# Patient Record
Sex: Female | Born: 1970 | Race: Black or African American | Hispanic: No | Marital: Married | Smoking: Never smoker
Health system: Southern US, Community
[De-identification: ages and names within clinical notes are randomized; demographics above are authoritative.]

## PROBLEM LIST (undated history)

## (undated) DIAGNOSIS — F1120 Opioid dependence, uncomplicated: Secondary | ICD-10-CM

## (undated) DIAGNOSIS — IMO0001 Reserved for inherently not codable concepts without codable children: Secondary | ICD-10-CM

## (undated) DIAGNOSIS — F32A Depression, unspecified: Secondary | ICD-10-CM

## (undated) DIAGNOSIS — F419 Anxiety disorder, unspecified: Secondary | ICD-10-CM

## (undated) DIAGNOSIS — Z22322 Carrier or suspected carrier of Methicillin resistant Staphylococcus aureus: Secondary | ICD-10-CM

## (undated) DIAGNOSIS — I10 Essential (primary) hypertension: Secondary | ICD-10-CM

## (undated) DIAGNOSIS — F4323 Adjustment disorder with mixed anxiety and depressed mood: Secondary | ICD-10-CM

## (undated) DIAGNOSIS — M797 Fibromyalgia: Secondary | ICD-10-CM

## (undated) DIAGNOSIS — G8929 Other chronic pain: Secondary | ICD-10-CM

## (undated) DIAGNOSIS — G43909 Migraine, unspecified, not intractable, without status migrainosus: Secondary | ICD-10-CM

## (undated) DIAGNOSIS — R7303 Prediabetes: Secondary | ICD-10-CM

## (undated) DIAGNOSIS — F319 Bipolar disorder, unspecified: Secondary | ICD-10-CM

## (undated) DIAGNOSIS — F329 Major depressive disorder, single episode, unspecified: Secondary | ICD-10-CM

## (undated) HISTORY — PX: KNEE ARTHROPLASTY: SHX992

## (undated) HISTORY — PX: OTHER SURGICAL HISTORY: SHX169

## (undated) HISTORY — PX: ABDOMINAL HYSTERECTOMY: SHX81

## (undated) HISTORY — PX: EAR CYST EXCISION: SHX22

## (undated) HISTORY — PX: TUBAL LIGATION: SHX77

---

## 2003-05-08 ENCOUNTER — Ambulatory Visit (HOSPITAL_COMMUNITY): Admission: RE | Admit: 2003-05-08 | Discharge: 2003-05-08 | Payer: Self-pay | Admitting: Family Medicine

## 2003-05-08 ENCOUNTER — Encounter: Payer: Self-pay | Admitting: Family Medicine

## 2009-08-20 ENCOUNTER — Emergency Department (HOSPITAL_COMMUNITY): Admission: EM | Admit: 2009-08-20 | Discharge: 2009-08-21 | Payer: Self-pay | Admitting: Emergency Medicine

## 2010-11-24 ENCOUNTER — Ambulatory Visit (HOSPITAL_COMMUNITY): Payer: Medicaid Other | Admitting: Physician Assistant

## 2010-12-14 ENCOUNTER — Encounter (HOSPITAL_COMMUNITY): Payer: Medicaid Other | Admitting: Physician Assistant

## 2011-01-12 ENCOUNTER — Encounter (HOSPITAL_COMMUNITY): Payer: Medicaid Other | Admitting: Physician Assistant

## 2011-01-12 DIAGNOSIS — F39 Unspecified mood [affective] disorder: Secondary | ICD-10-CM

## 2011-02-15 ENCOUNTER — Encounter (INDEPENDENT_AMBULATORY_CARE_PROVIDER_SITE_OTHER): Payer: Medicaid Other | Admitting: Physician Assistant

## 2011-02-15 DIAGNOSIS — F39 Unspecified mood [affective] disorder: Secondary | ICD-10-CM

## 2011-03-16 ENCOUNTER — Encounter (HOSPITAL_COMMUNITY): Payer: Medicaid Other | Admitting: Physician Assistant

## 2011-03-17 ENCOUNTER — Encounter (INDEPENDENT_AMBULATORY_CARE_PROVIDER_SITE_OTHER): Payer: Medicaid Other | Admitting: Physician Assistant

## 2011-03-17 DIAGNOSIS — F410 Panic disorder [episodic paroxysmal anxiety] without agoraphobia: Secondary | ICD-10-CM

## 2011-03-17 DIAGNOSIS — F39 Unspecified mood [affective] disorder: Secondary | ICD-10-CM

## 2011-04-14 ENCOUNTER — Encounter (INDEPENDENT_AMBULATORY_CARE_PROVIDER_SITE_OTHER): Payer: Medicaid Other | Admitting: Physician Assistant

## 2011-04-14 DIAGNOSIS — F39 Unspecified mood [affective] disorder: Secondary | ICD-10-CM

## 2011-04-15 ENCOUNTER — Ambulatory Visit (HOSPITAL_COMMUNITY): Payer: Medicaid Other | Admitting: Psychology

## 2011-04-21 ENCOUNTER — Ambulatory Visit (HOSPITAL_COMMUNITY): Payer: Medicaid Other | Admitting: Psychology

## 2011-05-13 ENCOUNTER — Encounter (HOSPITAL_COMMUNITY): Payer: Medicaid Other | Admitting: Psychology

## 2011-05-17 ENCOUNTER — Encounter (INDEPENDENT_AMBULATORY_CARE_PROVIDER_SITE_OTHER): Payer: Medicaid Other | Admitting: Physician Assistant

## 2011-05-17 DIAGNOSIS — F39 Unspecified mood [affective] disorder: Secondary | ICD-10-CM

## 2011-05-27 ENCOUNTER — Encounter (INDEPENDENT_AMBULATORY_CARE_PROVIDER_SITE_OTHER): Payer: Medicaid Other | Admitting: Psychology

## 2011-05-27 DIAGNOSIS — F411 Generalized anxiety disorder: Secondary | ICD-10-CM

## 2011-05-27 DIAGNOSIS — F329 Major depressive disorder, single episode, unspecified: Secondary | ICD-10-CM

## 2011-06-22 ENCOUNTER — Encounter (HOSPITAL_COMMUNITY): Payer: Medicaid Other | Admitting: Physician Assistant

## 2011-06-22 ENCOUNTER — Encounter (INDEPENDENT_AMBULATORY_CARE_PROVIDER_SITE_OTHER): Payer: Medicaid Other | Admitting: Physician Assistant

## 2011-06-22 DIAGNOSIS — F39 Unspecified mood [affective] disorder: Secondary | ICD-10-CM

## 2011-06-22 DIAGNOSIS — F410 Panic disorder [episodic paroxysmal anxiety] without agoraphobia: Secondary | ICD-10-CM

## 2011-07-19 ENCOUNTER — Encounter (INDEPENDENT_AMBULATORY_CARE_PROVIDER_SITE_OTHER): Payer: Medicaid Other | Admitting: Psychology

## 2011-07-19 DIAGNOSIS — F319 Bipolar disorder, unspecified: Secondary | ICD-10-CM

## 2011-07-28 ENCOUNTER — Encounter (HOSPITAL_COMMUNITY): Payer: Self-pay | Admitting: Physician Assistant

## 2011-08-23 ENCOUNTER — Ambulatory Visit (HOSPITAL_COMMUNITY): Payer: Medicaid Other | Admitting: Physician Assistant

## 2011-08-23 ENCOUNTER — Other Ambulatory Visit (HOSPITAL_COMMUNITY): Payer: Self-pay | Admitting: Physician Assistant

## 2011-08-23 DIAGNOSIS — F39 Unspecified mood [affective] disorder: Secondary | ICD-10-CM

## 2011-08-23 DIAGNOSIS — F410 Panic disorder [episodic paroxysmal anxiety] without agoraphobia: Secondary | ICD-10-CM

## 2011-08-23 MED ORDER — QUETIAPINE FUMARATE 300 MG PO TABS: 300.0000 mg | ORAL_TABLET | Freq: Every day | ORAL | Status: DC

## 2011-08-23 MED ORDER — ARIPIPRAZOLE 5 MG PO TABS: 5.0000 mg | ORAL_TABLET | Freq: Every day | ORAL | Status: DC

## 2011-08-23 MED ORDER — DESVENLAFAXINE SUCCINATE ER 100 MG PO TB24: 100.0000 mg | ORAL_TABLET | Freq: Every day | ORAL | Status: DC

## 2011-08-30 ENCOUNTER — Ambulatory Visit (HOSPITAL_COMMUNITY): Payer: Medicaid Other | Admitting: Psychology

## 2011-09-06 ENCOUNTER — Ambulatory Visit (INDEPENDENT_AMBULATORY_CARE_PROVIDER_SITE_OTHER): Payer: Medicaid Other | Admitting: Physician Assistant

## 2011-09-06 DIAGNOSIS — F39 Unspecified mood [affective] disorder: Secondary | ICD-10-CM

## 2011-09-06 DIAGNOSIS — F411 Generalized anxiety disorder: Secondary | ICD-10-CM

## 2011-09-06 MED ORDER — HYDROXYZINE HCL 50 MG PO TABS: ORAL_TABLET | ORAL | Status: DC

## 2011-09-06 MED ORDER — LAMOTRIGINE 100 MG PO TABS: 100.0000 mg | ORAL_TABLET | Freq: Every day | ORAL | Status: DC

## 2011-09-06 NOTE — Progress Notes (Signed)
   [facility] Follow-up Outpatient Visit  Mikailah Morel Feb 07, 1971  Date: 09/06/11   Subjective: Debra Cardenas presents today for followup on her mood disorder and anxiety disorder. She reports that she is doing very well. She is experiencing less irritability. She reports that she uses the Vistaril only at nighttime with rare uses during the day. She cannot tell if the Lamictal has made much difference. She denies any suicidal or homicidal ideation. She denies any auditory or visual hallucinations. She received a letter for her financial aid and she is able to return to school. In January. She will be enrolled in 2 classes, a math class and reading glasses.  There were no vitals filed for this visit.  Mental Status Examination  Appearance: Well groomed and neatly dressed Alert: Yes Attention: good  Cooperative: Yes Eye Contact: Good Speech: Clear and even Psychomotor Activity: Normal Memory/Concentration: Intact Oriented: person, place, time/date and situation Mood: Euthymic Affect: Congruent Thought Processes and Associations: Linear Fund of Knowledge: Good Thought Content:  Insight: Fair Judgement: Good  Diagnosis: Mood disorder, not otherwise specified, generalized anxiety disorder  Treatment Plan: We will continue all of her medications as prescribed and Fidelia will return for a followup in one month. At that time we will consider increasing the Lamictal.  Dystany Duffy, PA

## 2011-09-19 ENCOUNTER — Other Ambulatory Visit (HOSPITAL_COMMUNITY): Payer: Self-pay | Admitting: Physician Assistant

## 2011-09-19 DIAGNOSIS — F39 Unspecified mood [affective] disorder: Secondary | ICD-10-CM

## 2011-09-19 MED ORDER — QUETIAPINE FUMARATE 300 MG PO TABS: 300.0000 mg | ORAL_TABLET | Freq: Every day | ORAL | Status: DC

## 2011-09-30 ENCOUNTER — Emergency Department (HOSPITAL_BASED_OUTPATIENT_CLINIC_OR_DEPARTMENT_OTHER)
Admission: EM | Admit: 2011-09-30 | Discharge: 2011-10-01 | Disposition: A | Discharge status: Home / Self Care | Payer: Medicaid Other | Attending: Emergency Medicine | Admitting: Emergency Medicine

## 2011-09-30 ENCOUNTER — Emergency Department (INDEPENDENT_AMBULATORY_CARE_PROVIDER_SITE_OTHER): Payer: Medicaid Other

## 2011-09-30 ENCOUNTER — Encounter (HOSPITAL_BASED_OUTPATIENT_CLINIC_OR_DEPARTMENT_OTHER): Payer: Self-pay | Admitting: *Deleted

## 2011-09-30 DIAGNOSIS — N949 Unspecified condition associated with female genital organs and menstrual cycle: Secondary | ICD-10-CM

## 2011-09-30 DIAGNOSIS — D72829 Elevated white blood cell count, unspecified: Secondary | ICD-10-CM

## 2011-09-30 DIAGNOSIS — N760 Acute vaginitis: Secondary | ICD-10-CM | POA: Insufficient documentation

## 2011-09-30 DIAGNOSIS — R109 Unspecified abdominal pain: Secondary | ICD-10-CM

## 2011-09-30 DIAGNOSIS — A499 Bacterial infection, unspecified: Secondary | ICD-10-CM | POA: Insufficient documentation

## 2011-09-30 DIAGNOSIS — F319 Bipolar disorder, unspecified: Secondary | ICD-10-CM | POA: Insufficient documentation

## 2011-09-30 DIAGNOSIS — Z79899 Other long term (current) drug therapy: Secondary | ICD-10-CM | POA: Insufficient documentation

## 2011-09-30 DIAGNOSIS — B9689 Other specified bacterial agents as the cause of diseases classified elsewhere: Secondary | ICD-10-CM | POA: Insufficient documentation

## 2011-09-30 DIAGNOSIS — K769 Liver disease, unspecified: Secondary | ICD-10-CM

## 2011-09-30 DIAGNOSIS — IMO0001 Reserved for inherently not codable concepts without codable children: Secondary | ICD-10-CM | POA: Insufficient documentation

## 2011-09-30 DIAGNOSIS — F341 Dysthymic disorder: Secondary | ICD-10-CM | POA: Insufficient documentation

## 2011-09-30 DIAGNOSIS — K5900 Constipation, unspecified: Secondary | ICD-10-CM

## 2011-09-30 HISTORY — DX: Depression, unspecified: F32.A

## 2011-09-30 HISTORY — DX: Bipolar disorder, unspecified: F31.9

## 2011-09-30 HISTORY — DX: Anxiety disorder, unspecified: F41.9

## 2011-09-30 HISTORY — DX: Fibromyalgia: M79.7

## 2011-09-30 HISTORY — DX: Major depressive disorder, single episode, unspecified: F32.9

## 2011-09-30 LAB — URINALYSIS, ROUTINE W REFLEX MICROSCOPIC
Hgb urine dipstick: NEGATIVE
Ketones, ur: 15 mg/dL — AB
Protein, ur: 30 mg/dL — AB
Urobilinogen, UA: 1 mg/dL (ref 0.0–1.0)

## 2011-09-30 LAB — DIFFERENTIAL
Basophils Absolute: 0 10*3/uL (ref 0.0–0.1)
Basophils Relative: 0 % (ref 0–1)
Eosinophils Absolute: 0 10*3/uL (ref 0.0–0.7)
Neutro Abs: 10.5 10*3/uL — ABNORMAL HIGH (ref 1.7–7.7)
Neutrophils Relative %: 73 % (ref 43–77)

## 2011-09-30 LAB — WET PREP, GENITAL: Yeast Wet Prep HPF POC: NONE SEEN

## 2011-09-30 LAB — COMPREHENSIVE METABOLIC PANEL
ALT: 17 U/L (ref 0–35)
Alkaline Phosphatase: 113 U/L (ref 39–117)
CO2: 24 mEq/L (ref 19–32)
Chloride: 104 mEq/L (ref 96–112)
GFR calc Af Amer: >90 mL/min (ref >90)
GFR calc non Af Amer: >90 mL/min (ref >90)
Glucose, Bld: 112 mg/dL — ABNORMAL HIGH (ref 70–99)
Potassium: 4.7 mEq/L (ref 3.5–5.1)
Sodium: 136 mEq/L (ref 135–145)
Total Bilirubin: 0.2 mg/dL — ABNORMAL LOW (ref 0.3–1.2)

## 2011-09-30 LAB — URINE MICROSCOPIC-ADD ON

## 2011-09-30 LAB — CBC
MCHC: 33.6 g/dL (ref 30.0–36.0)
RDW: 15.5 % (ref 11.5–15.5)

## 2011-09-30 MED ORDER — IOHEXOL 300 MG/ML  SOLN
100.0000 mL | Freq: Once | INTRAMUSCULAR | Status: AC | PRN
  Administered 2011-09-30: 100 mL via INTRAVENOUS

## 2011-09-30 NOTE — ED Provider Notes (Signed)
History     CSN: [id number]  Arrival date & time 09/30/11  9147   First MD Initiated Contact with Patient 09/30/11 1925      Chief Complaint  Patient presents with  . Abdominal Cramping    (Consider location/radiation/quality/duration/timing/severity/associated sxs/prior treatment) Patient is a 41 y.o. female presenting with abdominal pain. The history is provided by the patient. No language interpreter was used.  Abdominal Pain The primary symptoms of the illness include abdominal pain. The current episode started 3 to 5 hours ago. The onset of the illness was sudden. The problem has been gradually worsening.  Associated with: none. The patient states that she believes she is currently not pregnant. The patient has not had a change in bowel habit. Risk factors for an acute abdominal problem include a history of abdominal surgery. Symptoms associated with the illness do not include urgency, hematuria, frequency or back pain. Significant associated medical issues do not include PUD, GERD, inflammatory bowel disease, diabetes, gallstones, liver disease, substance abuse or diverticulitis.    Past Medical History  Diagnosis Date  . Depression   . Anxiety   . Bipolar 1 disorder   . Fibromyalgia     Past Surgical History  Procedure Date  . Tubal ligation   . Cesarean section     No family history on file.  History  Substance Use Topics  . Smoking status: Never Smoker   . Smokeless tobacco: Not on file  . Alcohol Use: No    OB History    Grav Para Term Preterm Abortions TAB SAB Ect Mult Living                  Review of Systems  Gastrointestinal: Positive for abdominal pain.  Genitourinary: Negative for urgency, frequency and hematuria.  Musculoskeletal: Negative for back pain.  All other systems reviewed and are negative.    Allergies  Penicillins and Tylenol  Home Medications   Current Outpatient Rx  Name Route Sig Dispense Refill  . ARIPIPRAZOLE 5 MG PO  TABS Oral Take 5 mg by mouth daily.    . DESVENLAFAXINE SUCCINATE ER 100 MG PO TB24 Oral Take 1 tablet (100 mg total) by mouth daily. 30 tablet 0  . HYDROXYZINE HCL 50 MG PO TABS  Take two tablets by mouth at bedtime, and one tablet daily as needed 90 tablet 0  . IBUPROFEN 200 MG PO TABS Oral Take 600 mg by mouth every 6 (six) hours as needed. For pain    . LAMOTRIGINE 100 MG PO TABS Oral Take 1 tablet (100 mg total) by mouth daily. 30 tablet 0  . OXYCODONE HCL 5 MG PO CAPS Oral Take 5 mg by mouth 2 (two) times daily. For pain    . PREGABALIN 225 MG PO CAPS Oral Take 225 mg by mouth 2 (two) times daily.    . QUETIAPINE FUMARATE 300 MG PO TABS Oral Take 1 tablet (300 mg total) by mouth at bedtime. 30 tablet 0  . TRAMADOL HCL 50 MG PO TABS Oral Take 50 mg by mouth every 6 (six) hours as needed. For pain    . ARIPIPRAZOLE 5 MG PO TABS Oral Take 1 tablet (5 mg total) by mouth daily. 30 tablet 0    BP 155/93  Pulse 97  Temp(Src) 98.2 F (36.8 C) (Oral)  Resp 16  Ht 5\' 7"  (1.702 m)  Wt 300 lb (136.079 kg)  BMI 46.99 kg/m2  SpO2 100%  Physical Exam  Nursing note and  vitals reviewed. Constitutional: She is oriented to person, place, and time. She appears well-developed and well-nourished.  HENT:  Head: Normocephalic and atraumatic.  Right Ear: External ear normal.  Left Ear: External ear normal.  Nose: Nose normal.  Mouth/Throat: Oropharynx is clear and moist.  Eyes: Pupils are equal, round, and reactive to light.  Neck: Normal range of motion. Neck supple.  Cardiovascular: Normal rate and normal heart sounds.   Pulmonary/Chest: Effort normal.  Abdominal: Soft. There is tenderness.  Genitourinary: Vagina normal.       Adnexa nontender, no masses  Musculoskeletal: Normal range of motion.  Neurological: She is alert and oriented to person, place, and time. She has normal reflexes.  Skin: Skin is warm.  Psychiatric: She has a normal mood and affect.    ED Course  Procedures  (including critical care time)  Labs Reviewed  WET PREP, GENITAL - Abnormal; Notable for the following:    Clue Cells, Wet Prep MODERATE (*)    WBC, Wet Prep HPF POC MODERATE (*)    All other components within normal limits  URINALYSIS, ROUTINE W REFLEX MICROSCOPIC - Abnormal; Notable for the following:    Color, Urine AMBER (*) BIOCHEMICALS MAY BE AFFECTED BY COLOR   APPearance CLOUDY (*)    Specific Gravity, Urine 1.041 (*)    Bilirubin Urine SMALL (*)    Ketones, ur 15 (*)    Protein, ur 30 (*)    All other components within normal limits  URINE MICROSCOPIC-ADD ON - Abnormal; Notable for the following:    Squamous Epithelial / LPF FEW (*)    Bacteria, UA FEW (*)    All other components within normal limits  GC/CHLAMYDIA PROBE AMP, GENITAL   Dg Abd 1 View  09/30/2011  *RADIOLOGY REPORT*  Clinical Data: Severe pelvic pain.  History of tubal ligation and prior ablation.  ABDOMEN - 1 VIEW  Comparison: None.  Findings: 2 supine views of the abdomen and pelvis.  Extensive density difference secondary to overlying pannus.  No gross free intraperitoneal air.  No bowel obstruction.  Moderate amount of colonic stool. No abnormal abdominal calcifications.   No appendicolith.  IMPRESSION: No acute findings.  Possible constipation.  Original Report Authenticated By: Consuello Bossier, M.D.     1. Bacterial vaginosis       MDM  [facility],  Pt has a few clue cells,  Pt was given torodol before coming here.  Pt reports pain was better but now is returning.  I ordered Ct scan.  Medical screening examination/treatment/procedure(s) were conducted as a shared visit with non-physician practitioner(s) and myself.  I personally evaluated the patient during the encounter Patient had laboratory testing which showed bacterial vaginosis.She can take metronidazole 500 mg bid x 7 days to treat bacterial vaginosis.   CT the abdomen showed a small lesion in the liver.  The radiologist  recommended that this be further evaluated with MRI with and without contrast as a non-emergency study.  She can request her private physician to order this test for her.      Langston Masker, [place] 09/30/11 2242  Carleene Cooper III, MD 10/01/11 0010  Carleene Cooper III, MD 10/01/11 Burna Mortimer

## 2011-09-30 NOTE — ED Notes (Signed)
[facility] wbc 15.0 and abd cramping x 2 days

## 2011-10-01 ENCOUNTER — Emergency Department (HOSPITAL_BASED_OUTPATIENT_CLINIC_OR_DEPARTMENT_OTHER): Payer: Medicaid Other

## 2011-10-01 MED ORDER — METRONIDAZOLE 500 MG PO TABS: 500.0000 mg | ORAL_TABLET | Freq: Two times a day (BID) | ORAL | Status: DC

## 2011-10-01 MED ORDER — METRONIDAZOLE 500 MG PO TABS
500.0000 mg | ORAL_TABLET | Freq: Once | ORAL | Status: AC
Start: 2011-10-01 — End: 2011-10-01
  Administered 2011-10-01: 500 mg via ORAL
  Filled 2011-10-01: qty 1

## 2011-10-01 MED ORDER — METRONIDAZOLE 500 MG PO TABS: 500.0000 mg | ORAL_TABLET | Freq: Two times a day (BID) | ORAL | Status: AC

## 2011-10-01 NOTE — Discharge Instructions (Signed)
Debra Cardenas, you had physical examination and laboratory testing and CT x-ray of the abdomen and pelvis to check on you for abdominal pain. Your laboratory tests showed to have a vaginal infection called bacterial vaginosis. He will need to take the medicine metronidazole 500 mg twice a day for one week to treat this infection. You should not drink alcohol while taking metronidazole as that could cause you to vomit.  The CT x-ray of your abdomen and pelvis showed a small spot on your liver. The radiologist recommends that you have a MRI of the liver with and without contrast to check on this further. This test can be ordered by your regular doctor.Bacterial Vaginosis Bacterial vaginosis (BV) is a vaginal infection where the normal balance of bacteria in the vagina is disrupted. The normal balance is then replaced by an overgrowth of certain bacteria. There are several different kinds of bacteria that can cause BV. BV is the most common vaginal infection in women of childbearing age. CAUSES   The cause of BV is not fully understood. BV develops when there is an increase or imbalance of harmful bacteria.   Some activities or behaviors can upset the normal balance of bacteria in the vagina and put women at increased risk including:   Having a new sex partner or multiple sex partners.   Douching.   Using an intrauterine device (IUD) for contraception.   It is not clear what role sexual activity plays in the development of BV. However, women that have never had sexual intercourse are rarely infected with BV.  Women do not get BV from toilet seats, bedding, swimming pools or from touching objects around them.  SYMPTOMS   Grey vaginal discharge.   A fish-like odor with discharge, especially after sexual intercourse.   Itching or burning of the vagina and vulva.   Burning or pain with urination.   Some women have no signs or symptoms at all.  DIAGNOSIS  Your caregiver must examine the  vagina for signs of BV. Your caregiver will perform lab tests and look at the sample of vaginal fluid through a microscope. They will look for bacteria and abnormal cells (clue cells), a pH test higher than 4.5, and a positive amine test all associated with BV.  RISKS AND COMPLICATIONS   Pelvic inflammatory disease (PID).   Infections following gynecology surgery.   Developing HIV.   Developing herpes virus.  TREATMENT  Sometimes BV will clear up without treatment. However, all women with symptoms of BV should be treated to avoid complications, especially if gynecology surgery is planned. Female partners generally do not need to be treated. However, BV may spread between female sex partners so treatment is helpful in preventing a recurrence of BV.   BV may be treated with antibiotics. The antibiotics come in either pill or vaginal cream forms. Either can be used with nonpregnant or pregnant women, but the recommended dosages differ. These antibiotics are not harmful to the baby.   BV can recur after treatment. If this happens, a second round of antibiotics will often be prescribed.   Treatment is important for pregnant women. If not treated, BV can cause a premature delivery, especially for a pregnant woman who had a premature birth in the past. All pregnant women who have symptoms of BV should be checked and treated.   For chronic reoccurrence of BV, treatment with a type of prescribed gel vaginally twice a week is helpful.  HOME CARE INSTRUCTIONS   Finish all medication  as directed by your caregiver.   Do not have sex until treatment is completed.   Tell your sexual partner that you have a vaginal infection. They should see their caregiver and be treated if they have problems, such as a mild rash or itching.   Practice safe sex. Use condoms. Only have 1 sex partner.  PREVENTION  Basic prevention steps can help reduce the risk of upsetting the natural balance of bacteria in the vagina  and developing BV:  Do not have sexual intercourse (be abstinent).   Do not douche.   Use all of the medicine prescribed for treatment of BV, even if the signs and symptoms go away.   Tell your sex partner if you have BV. That way, they can be treated, if needed, to prevent reoccurrence.  SEEK MEDICAL CARE IF:   Your symptoms are not improving after 3 days of treatment.   You have increased discharge, pain, or fever.  MAKE SURE YOU:   Understand these instructions.   Will watch your condition.   Will get help right away if you are not doing well or get worse.  FOR MORE INFORMATION  Division of STD Prevention (DSTDP), Centers for Disease Control and Prevention: SolutionApps.co.za American Social Health Association (ASHA): www.ashastd.org  Document Released: 09/05/2005 Document Revised: 05/18/2011 Document Reviewed: 02/26/2009 [facility] Patient Information 2012 [place], [place].

## 2011-10-03 LAB — GC/CHLAMYDIA PROBE AMP, GENITAL
Chlamydia, DNA Probe: NEGATIVE
GC Probe Amp, Genital: NEGATIVE

## 2011-10-04 ENCOUNTER — Other Ambulatory Visit (HOSPITAL_BASED_OUTPATIENT_CLINIC_OR_DEPARTMENT_OTHER): Payer: Self-pay | Admitting: Emergency Medicine

## 2011-10-08 ENCOUNTER — Ambulatory Visit (HOSPITAL_BASED_OUTPATIENT_CLINIC_OR_DEPARTMENT_OTHER)
Admission: RE | Admit: 2011-10-08 | Discharge: 2011-10-08 | Disposition: A | Discharge status: Home / Self Care | Payer: Medicaid Other | Source: Ambulatory Visit | Attending: Emergency Medicine | Admitting: Emergency Medicine

## 2011-10-08 DIAGNOSIS — K7689 Other specified diseases of liver: Secondary | ICD-10-CM | POA: Insufficient documentation

## 2011-10-08 DIAGNOSIS — R109 Unspecified abdominal pain: Secondary | ICD-10-CM | POA: Insufficient documentation

## 2011-10-08 DIAGNOSIS — K8020 Calculus of gallbladder without cholecystitis without obstruction: Secondary | ICD-10-CM | POA: Insufficient documentation

## 2011-10-08 MED ORDER — GADOBENATE DIMEGLUMINE 529 MG/ML IV SOLN
20.0000 mL | Freq: Once | INTRAVENOUS | Status: AC | PRN
Start: 2011-10-08 — End: 2011-10-08
  Administered 2011-10-08: 20 mL via INTRAVENOUS

## 2011-10-12 ENCOUNTER — Ambulatory Visit (HOSPITAL_COMMUNITY): Payer: Medicaid Other | Admitting: Physician Assistant

## 2011-11-07 ENCOUNTER — Ambulatory Visit (INDEPENDENT_AMBULATORY_CARE_PROVIDER_SITE_OTHER): Payer: Medicaid Other | Admitting: Physician Assistant

## 2011-11-07 DIAGNOSIS — F39 Unspecified mood [affective] disorder: Secondary | ICD-10-CM

## 2011-11-07 MED ORDER — QUETIAPINE FUMARATE 300 MG PO TABS: 300.0000 mg | ORAL_TABLET | Freq: Every day | ORAL | Status: DC

## 2011-11-07 NOTE — Progress Notes (Signed)
   [facility] Follow-up Outpatient Visit  Debra Cardenas 23-Sep-1970  Date: 11/07/11   Subjective: Debra Cardenas presents today to followup on her medications for her depression and anxiety. She reports that she developed a sore place on her tongue, and some smaller bumps in her mouth, so she discontinued the Lamictal. She had her daughter take a photograph of the lesion on her tongue, and she showed that photograph to me. The photograph showed a red and raw appearing lesion on the right lateral surface of her tongue, that was approximately 2 cm in length. Debra Cardenas complains that her mood is somewhat "up and down." Her sleep is good, and she is eating well. She has changed her diet recently to fruits and vegetables, which has been a struggle, but she hopes to lose some weight. She denies any suicidal or homicidal ideation. She denies any auditory or visual hallucinations.  There were no vitals filed for this visit.  Mental Status Examination  Appearance: Well groomed and dressed Alert: Yes Attention: good  Cooperative: Yes Eye Contact: Good Speech: Clear and even Psychomotor Activity: Normal Memory/Concentration: Intact Oriented: person, place, time/date and situation Mood: Anxious Affect: Appropriate Thought Processes and Associations: Circumstantial and Linear Fund of Knowledge: Good Thought Content:  Insight: Good Judgement: Good  Diagnosis: Mood disorder not otherwise specified  Treatment Plan: We will discontinue her Lamictal as she developed sores in her mouth. We will continue her other medications as currently prescribed and have her followup in one month. We may consider Topamax as a mood stabilizer. She is encouraged to resume a walking routine to improve her mood as well as lose weight.  Debra Newmann, PA

## 2011-11-14 ENCOUNTER — Other Ambulatory Visit (HOSPITAL_COMMUNITY): Payer: Self-pay | Admitting: *Deleted

## 2011-11-14 DIAGNOSIS — F410 Panic disorder [episodic paroxysmal anxiety] without agoraphobia: Secondary | ICD-10-CM

## 2011-11-14 DIAGNOSIS — F39 Unspecified mood [affective] disorder: Secondary | ICD-10-CM

## 2011-11-14 MED ORDER — DESVENLAFAXINE SUCCINATE ER 100 MG PO TB24: 100.0000 mg | ORAL_TABLET | Freq: Every day | ORAL | Status: DC

## 2011-11-14 MED ORDER — ARIPIPRAZOLE 5 MG PO TABS: 5.0000 mg | ORAL_TABLET | Freq: Every day | ORAL | Status: DC

## 2011-11-17 ENCOUNTER — Other Ambulatory Visit (HOSPITAL_COMMUNITY): Payer: Self-pay | Admitting: *Deleted

## 2011-11-17 DIAGNOSIS — F39 Unspecified mood [affective] disorder: Secondary | ICD-10-CM

## 2011-11-17 MED ORDER — HYDROXYZINE HCL 50 MG PO TABS: ORAL_TABLET | ORAL | Status: DC

## 2011-12-06 ENCOUNTER — Ambulatory Visit (HOSPITAL_COMMUNITY): Payer: Medicaid Other | Admitting: Physician Assistant

## 2011-12-07 ENCOUNTER — Ambulatory Visit (HOSPITAL_COMMUNITY): Payer: Medicaid Other | Admitting: Physician Assistant

## 2011-12-19 ENCOUNTER — Other Ambulatory Visit (HOSPITAL_COMMUNITY): Payer: Self-pay | Admitting: *Deleted

## 2011-12-19 DIAGNOSIS — F410 Panic disorder [episodic paroxysmal anxiety] without agoraphobia: Secondary | ICD-10-CM

## 2011-12-19 DIAGNOSIS — F39 Unspecified mood [affective] disorder: Secondary | ICD-10-CM

## 2011-12-19 MED ORDER — QUETIAPINE FUMARATE 300 MG PO TABS: 300.0000 mg | ORAL_TABLET | Freq: Every day | ORAL | Status: DC

## 2011-12-19 MED ORDER — DESVENLAFAXINE SUCCINATE ER 100 MG PO TB24: 100.0000 mg | ORAL_TABLET | Freq: Every day | ORAL | Status: DC

## 2011-12-19 MED ORDER — ARIPIPRAZOLE 5 MG PO TABS: 5.0000 mg | ORAL_TABLET | Freq: Every day | ORAL | Status: DC

## 2012-01-18 ENCOUNTER — Ambulatory Visit (INDEPENDENT_AMBULATORY_CARE_PROVIDER_SITE_OTHER): Payer: Medicaid Other | Admitting: Physician Assistant

## 2012-01-18 DIAGNOSIS — F411 Generalized anxiety disorder: Secondary | ICD-10-CM

## 2012-01-18 DIAGNOSIS — F39 Unspecified mood [affective] disorder: Secondary | ICD-10-CM | POA: Insufficient documentation

## 2012-01-18 MED ORDER — HYDROXYZINE HCL 50 MG PO TABS: ORAL_TABLET | ORAL | Status: DC

## 2012-01-18 MED ORDER — CARBAMAZEPINE ER 100 MG PO TB12: 100.0000 mg | ORAL_TABLET | Freq: Two times a day (BID) | ORAL | Status: DC

## 2012-01-18 MED ORDER — QUETIAPINE FUMARATE 300 MG PO TABS: 300.0000 mg | ORAL_TABLET | Freq: Every day | ORAL | Status: DC

## 2012-01-18 NOTE — Progress Notes (Signed)
   [facility] Follow-up Outpatient Visit  Debra Cardenas 12-26-1970 Date: 01/18/2012  Subjective: Debra Cardenas presents today to followup on her medications prescribed for anxiety and mood swings. She has been off of the Lamictal for about 2 months now. She reports that she is doing "okay" but has been experiencing mood swings. She also endorses poor sleep, and takes her Seroquel at about 8 PM but cannot fall asleep until midnight. She then has to get up at 6. She endorses that she has been exercising several days a week. She denies any suicidal or homicidal ideation she denies any visual hallucinations, but does endorse some auditory hallucinations of hearing her name called. She also reports that she is having some anxiety while driving.  There were no vitals filed for this visit.  Mental Status Examination  Appearance: Well groomed and neatly dressed Alert: Yes Attention: good  Cooperative: Yes Eye Contact: Good Speech: Clear and even Psychomotor Activity: Normal Memory/Concentration: Intact Oriented: person, place, time/date and situation Mood: Euthymic Affect: Appropriate Thought Processes and Associations: Goal Directed and Linear Fund of Knowledge: Fair Thought Content: Auditory hallucinations Insight: Good Judgement: Good  Diagnosis: Mood disorder not otherwise specified, generalized anxiety disorder  Treatment Plan: We'd discussed several mood stabilizers, and found that she had had adverse reactions to many. She reports that Topamax and Depakote gave her headaches, and Neurontin caused her legs to swell. She is uncertain if she is taking Tegretol, so we will start her on 100 mg twice daily. We will continue her Abilify at 5 mg daily Thayer Ohm take 100 mg daily Seroquel 300 mg daily and Vistaril 100 mg at bedtime. She will followup in 6 weeks.  Lev Cervone, PA-C

## 2012-01-19 ENCOUNTER — Ambulatory Visit (HOSPITAL_COMMUNITY): Payer: Medicaid Other | Admitting: Physician Assistant

## 2012-02-29 ENCOUNTER — Ambulatory Visit (HOSPITAL_COMMUNITY): Payer: Self-pay | Admitting: Physician Assistant

## 2012-03-19 ENCOUNTER — Other Ambulatory Visit (HOSPITAL_COMMUNITY): Payer: Self-pay | Admitting: *Deleted

## 2012-03-19 DIAGNOSIS — F39 Unspecified mood [affective] disorder: Secondary | ICD-10-CM

## 2012-03-19 MED ORDER — QUETIAPINE FUMARATE 300 MG PO TABS: 300.0000 mg | ORAL_TABLET | Freq: Every day | ORAL | Status: DC

## 2012-04-11 ENCOUNTER — Ambulatory Visit (HOSPITAL_COMMUNITY): Payer: Self-pay | Admitting: Physician Assistant

## 2012-05-18 ENCOUNTER — Telehealth (HOSPITAL_COMMUNITY): Payer: Self-pay

## 2012-05-18 NOTE — Telephone Encounter (Signed)
1:15pm 05/18/12 pt called for a refill on her medication SEROQUEL 300MG  - informed pt to call her pharmacy and also that we are closed on Monday due to holiday./sh

## 2012-05-23 ENCOUNTER — Telehealth (HOSPITAL_COMMUNITY): Payer: Self-pay | Admitting: *Deleted

## 2012-05-23 ENCOUNTER — Other Ambulatory Visit (HOSPITAL_COMMUNITY): Payer: Self-pay | Admitting: *Deleted

## 2012-05-23 DIAGNOSIS — F39 Unspecified mood [affective] disorder: Secondary | ICD-10-CM

## 2012-05-23 MED ORDER — QUETIAPINE FUMARATE 300 MG PO TABS: 300.0000 mg | ORAL_TABLET | Freq: Every day | ORAL | Status: DC

## 2012-05-24 ENCOUNTER — Other Ambulatory Visit (HOSPITAL_COMMUNITY): Payer: Self-pay | Admitting: *Deleted

## 2012-05-24 DIAGNOSIS — F39 Unspecified mood [affective] disorder: Secondary | ICD-10-CM

## 2012-05-24 DIAGNOSIS — F410 Panic disorder [episodic paroxysmal anxiety] without agoraphobia: Secondary | ICD-10-CM

## 2012-05-24 MED ORDER — DESVENLAFAXINE SUCCINATE ER 100 MG PO TB24: 100.0000 mg | ORAL_TABLET | Freq: Every day | ORAL | Status: DC

## 2012-05-24 MED ORDER — CARBAMAZEPINE ER 100 MG PO TB12: 100.0000 mg | ORAL_TABLET | Freq: Two times a day (BID) | ORAL | Status: DC

## 2012-05-24 MED ORDER — ARIPIPRAZOLE 5 MG PO TABS: 5.0000 mg | ORAL_TABLET | Freq: Every day | ORAL | Status: DC

## 2012-05-24 NOTE — Telephone Encounter (Signed)
Contacted pt on 9/4 to clarify which pharmacy to send refills to as have received requests from [facility] and [facility]. Pt states send refill to pharmacy that request came from

## 2012-05-24 NOTE — Telephone Encounter (Signed)
Error

## 2012-06-07 ENCOUNTER — Ambulatory Visit (INDEPENDENT_AMBULATORY_CARE_PROVIDER_SITE_OTHER): Payer: Medicaid Other | Admitting: Physician Assistant

## 2012-06-07 DIAGNOSIS — F410 Panic disorder [episodic paroxysmal anxiety] without agoraphobia: Secondary | ICD-10-CM

## 2012-06-07 DIAGNOSIS — F39 Unspecified mood [affective] disorder: Secondary | ICD-10-CM

## 2012-06-07 DIAGNOSIS — F411 Generalized anxiety disorder: Secondary | ICD-10-CM

## 2012-06-07 MED ORDER — ARIPIPRAZOLE 10 MG PO TABS: 10.0000 mg | ORAL_TABLET | Freq: Every day | ORAL | Status: DC

## 2012-06-07 MED ORDER — HYDROXYZINE HCL 50 MG PO TABS: ORAL_TABLET | ORAL | Status: DC

## 2012-06-07 MED ORDER — CARBAMAZEPINE ER 100 MG PO TB12: 100.0000 mg | ORAL_TABLET | Freq: Every day | ORAL | Status: DC

## 2012-07-25 ENCOUNTER — Other Ambulatory Visit (HOSPITAL_COMMUNITY): Payer: Self-pay | Admitting: *Deleted

## 2012-07-25 DIAGNOSIS — F39 Unspecified mood [affective] disorder: Secondary | ICD-10-CM

## 2012-07-25 MED ORDER — QUETIAPINE FUMARATE 300 MG PO TABS: 300.0000 mg | ORAL_TABLET | Freq: Every day | ORAL | Status: DC

## 2012-07-25 NOTE — Telephone Encounter (Signed)
Pt called to request refill of Seroquel

## 2012-08-07 ENCOUNTER — Ambulatory Visit (INDEPENDENT_AMBULATORY_CARE_PROVIDER_SITE_OTHER): Payer: Medicaid Other | Admitting: Physician Assistant

## 2012-08-07 DIAGNOSIS — F410 Panic disorder [episodic paroxysmal anxiety] without agoraphobia: Secondary | ICD-10-CM

## 2012-08-07 DIAGNOSIS — F39 Unspecified mood [affective] disorder: Secondary | ICD-10-CM

## 2012-08-07 DIAGNOSIS — F411 Generalized anxiety disorder: Secondary | ICD-10-CM

## 2012-08-07 MED ORDER — ARIPIPRAZOLE 10 MG PO TABS: 10.0000 mg | ORAL_TABLET | Freq: Every day | ORAL | Status: DC

## 2012-08-07 MED ORDER — CARBAMAZEPINE ER 100 MG PO TB12: 100.0000 mg | ORAL_TABLET | Freq: Every day | ORAL | Status: DC

## 2012-08-07 MED ORDER — HYDROXYZINE HCL 50 MG PO TABS: ORAL_TABLET | ORAL | Status: DC

## 2012-08-07 MED ORDER — QUETIAPINE FUMARATE 300 MG PO TABS: 300.0000 mg | ORAL_TABLET | Freq: Every day | ORAL | Status: DC

## 2012-08-07 MED ORDER — DESVENLAFAXINE SUCCINATE ER 100 MG PO TB24: 100.0000 mg | ORAL_TABLET | Freq: Every day | ORAL | Status: DC

## 2012-08-07 NOTE — Progress Notes (Signed)
   [facility] Follow-up Outpatient Visit  Debra Cardenas 01/13/1971  Date: 08/07/2012   Subjective: Debra Cardenas presents today to followup on her treatment for her mood disorder and anxiety. She reports that overall she is doing very well. Her mood is still mildly labile, but her mood changes seem to be dependent on her environment. She is sleeping well with the Seroquel, and takes Vistaril approximately 4 nights out of 7. She continues to attend classes at [employer], and her grades are good-1 A and 2 B's. She is about halfway through that program, and plans to attend [place] [employer] after completion. She expresses an interest in working in the substance abuse field. She denies any suicidal or homicidal ideation. She denies any auditory or visual hallucinations.  There were no vitals filed for this visit.  Mental Status Examination  Appearance: Well groomed and nicely dressed Alert: Yes Attention: good  Cooperative: Yes Eye Contact: Good Speech: Clear and coherent Psychomotor Activity: Normal Memory/Concentration: Intact Oriented: person, place, time/date and situation Mood: Euthymic Affect: Appropriate Thought Processes and Associations: Linear Fund of Knowledge: Good Thought Content: Normal Insight: Good Judgement: Good  Diagnosis: Mood disorder not otherwise specified, generalized anxiety disorder.  Treatment Plan: We will continue her Abilify 10 mg daily, Tegretol-XR 100 mg each morning , prostatic 100 mg daily, Vistaril 100 mg at bedtime as needed, Seroquel 300 mg at bedtime. She will return for followup in 3 months.  Takirah Binford, PA-C

## 2012-08-07 NOTE — Progress Notes (Signed)
   [facility] Follow-up Outpatient Visit  Debra Cardenas [birth year]  Date: 06/07/2012   Subjective: [name] presents today to followup on her treatment for her mood disorder and anxiety. She continues to take classes at [employer] in [employer] studying criminal justice and substance abuse. She reports that the Tegretol helps with her mood some days but she feels really high on other days. Without the Tegretol she gets really low. She takes her Seroquel between 8:30 and 9:30, and Vistaril to help her fall asleep. She continues take about 3 hours to fall asleep and gets up at 7:30 AM. She denies any suicidal or homicidal ideation. She denies any auditory or visual hallucinations.  There were no vitals filed for this visit.  Mental Status Examination  Appearance: Well groomed and nicely dressed Alert: Yes Attention: good  Cooperative: Yes Eye Contact: Good Speech: Clear and coherent Psychomotor Activity: Normal Memory/Concentration: Intact Oriented: person, place, time/date and situation Mood: Euthymic Affect: Appropriate Thought Processes and Associations: Linear Fund of Knowledge: Good Thought Content: Normal Insight: Good Judgement: Good  Diagnosis: Mood disorder not otherwise specified, generalized anxiety disorder.    Treatment Plan: We will increase her Abilify to 10 mg, and decrease her Tegretol XL to 100 mg each morning. We'll continue her Seroquel 300 mg each evening and Vistaril 100 mg at bedtime as needed. She will return for followup in 2 months.  Sven Pinheiro, PA-C

## 2012-09-21 ENCOUNTER — Emergency Department (HOSPITAL_BASED_OUTPATIENT_CLINIC_OR_DEPARTMENT_OTHER)
Admission: EM | Admit: 2012-09-21 | Discharge: 2012-09-21 | Disposition: A | Discharge status: Home / Self Care | Payer: Medicaid Other | Attending: Emergency Medicine | Admitting: Emergency Medicine

## 2012-09-21 ENCOUNTER — Encounter (HOSPITAL_BASED_OUTPATIENT_CLINIC_OR_DEPARTMENT_OTHER): Payer: Self-pay | Admitting: *Deleted

## 2012-09-21 DIAGNOSIS — R102 Pelvic and perineal pain: Secondary | ICD-10-CM

## 2012-09-21 DIAGNOSIS — Z79899 Other long term (current) drug therapy: Secondary | ICD-10-CM | POA: Insufficient documentation

## 2012-09-21 DIAGNOSIS — N949 Unspecified condition associated with female genital organs and menstrual cycle: Secondary | ICD-10-CM | POA: Insufficient documentation

## 2012-09-21 DIAGNOSIS — F329 Major depressive disorder, single episode, unspecified: Secondary | ICD-10-CM | POA: Insufficient documentation

## 2012-09-21 DIAGNOSIS — Z9851 Tubal ligation status: Secondary | ICD-10-CM | POA: Insufficient documentation

## 2012-09-21 DIAGNOSIS — Z3202 Encounter for pregnancy test, result negative: Secondary | ICD-10-CM | POA: Insufficient documentation

## 2012-09-21 DIAGNOSIS — F3289 Other specified depressive episodes: Secondary | ICD-10-CM | POA: Insufficient documentation

## 2012-09-21 LAB — URINALYSIS, ROUTINE W REFLEX MICROSCOPIC
Bilirubin Urine: NEGATIVE
Nitrite: NEGATIVE
Specific Gravity, Urine: 1.027 (ref 1.005–1.030)
Urobilinogen, UA: 0.2 mg/dL (ref 0.0–1.0)

## 2012-09-21 LAB — WET PREP, GENITAL: Trich, Wet Prep: NONE SEEN

## 2012-09-21 LAB — URINE MICROSCOPIC-ADD ON

## 2012-09-21 MED ORDER — HYDROMORPHONE HCL PF 2 MG/ML IJ SOLN
2.0000 mg | Freq: Once | INTRAMUSCULAR | Status: AC
  Administered 2012-09-21: 2 mg via INTRAMUSCULAR
  Filled 2012-09-21: qty 1

## 2012-09-21 MED ORDER — OXYCODONE HCL 15 MG PO TABS: 15.0000 mg | ORAL_TABLET | ORAL | Status: DC | PRN

## 2012-09-21 NOTE — ED Notes (Signed)
States she is having uterus pain. Has had this pain on and off since having an abrasion 2011.

## 2012-09-21 NOTE — ED Provider Notes (Signed)
History     CSN: [id number]  Arrival date & time 09/21/12  1409   First MD Initiated Contact with Patient 09/21/12 1427      Chief Complaint  Patient presents with  . Pelvic Pain    (Consider location/radiation/quality/duration/timing/severity/associated sxs/prior treatment) The history is provided by the patient. No language interpreter was used.   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  She is normally followed at [facility].  Patient states that she's been taking her Roxicodone without relief of her symptoms.  She's also been taking tramadol.  Today her pain was so great that she was unable to stand and her husband made her come to the emergency department.  She did contact her primary doctor but was unable to get in with him today. Patient states that she has had this pain intermittently and has been treated previously with Flagyl which has decreased her symptoms. Patient describes the pain as bilateral pelvic crampy pain.  She rates it at a 10 out of 10 today.  It is intermittent.  She denies any bleeding from the uterus, she denies any vaginal symptoms. Patient denies any her nares symptoms.  Denies fevers, chills, myalgias, arthralgias. Denies DOE, SOB, chest tightness or pressure, radiation to left arm, jaw or back, or diaphoresis. Denies dysuria, flank pain, suprapubic pain, frequency, urgency, or hematuria. Denies headaches, light headedness, weakness, visual disturbances. Denies  nausea, vomiting, diarrhea or constipation.   Past Medical History  Diagnosis Date  . Depression     Past Surgical History  Procedure Date  . Cesarean section   . Tubal ligation   . Uterine ablasion     No family history on file.  History  Substance Use Topics  . Smoking status: Not on file  . Smokeless tobacco: Not on file  . Alcohol Use: No      OB History    Grav Para Term Preterm Abortions TAB SAB Ect Mult Living                  Review of Systems Ten systems reviewed and are negative for acute change, except as noted in the HPI.   Allergies  Review of patient's allergies indicates not on file.  Home Medications   Current Outpatient Rx  Name  Route  Sig  Dispense  Refill  . ABILIFY PO   Oral   Take by mouth.         . TEGRETOL PO   Oral   Take by mouth.         Marland Kitchen PRISTIQ PO   Oral   Take by mouth.         Marland Kitchen LASIX PO   Oral   Take by mouth.         Marland Kitchen GLUCOSE BLOOD VI STRP   Other   1 each by Other route as needed. Use as instructed         . VISTARIL PO   Oral   Take by mouth.         Marland Kitchen LIDOCAINE 5 % EX PTCH   Transdermal   Place 1 patch onto the skin daily. Remove & Discard patch within 12 hours or as directed by MD         . MOBIC PO   Oral   Take by mouth.         Marland Kitchen  ROXICODONE PO   Oral   Take by mouth.         Marland Kitchen LYRICA PO   Oral   Take by mouth.         . SEROQUEL PO   Oral   Take by mouth.         . TRAMADOL HCL PO   Oral   Take by mouth.           BP 130/73  Pulse 98  Temp 97.9 F (36.6 C) (Oral)  Resp 12  Ht 5\' 7"  (1.702 m)  Wt 320 lb (145.151 kg)  BMI 50.12 kg/m2  SpO2 97%  Physical Exam Physical Exam  Nursing note and vitals reviewed. Constitutional: She is oriented to person, place, and time. She appears well-developed and well-nourished. No distress.  HENT:  Head: Normocephalic and atraumatic.  Eyes: Conjunctivae normal and EOM are normal. Pupils are equal, round, and reactive to light. No scleral icterus.  Neck: Normal range of motion.  Cardiovascular: Normal rate, regular rhythm and normal heart sounds.  Exam reveals no gallop and no friction rub.   No murmur heard. Pulmonary/Chest: Effort normal and breath sounds normal. No respiratory distress.  Abdominal: Soft. Bowel sounds are normal. She exhibits no distension and no mass.  There is no tenderness. There is no guarding.  Neurological: She is alert and oriented to person, place, and time.  Skin: Skin is warm and dry. She is not diaphoretic.  Genitourinary: Pelvic exam: VULVA: normal appearing vulva with no masses, tenderness or lesions, VAGINA: vaginal discharge - malodorous, milky and thin, CERVIX: normal appearing cervix without discharge or lesions,exam chaperoned .   ED Course  Procedures (including critical care time)   Labs Reviewed  WET PREP, GENITAL  GC/CHLAMYDIA PROBE AMP  URINALYSIS, ROUTINE W REFLEX MICROSCOPIC  PREGNANCY, URINE   No results found.   1. Chronic pelvic pain in female       MDM  3:43 PM BP 130/73  Pulse 98  Temp 97.9 F (36.6 C) (Oral)  Resp 12  Ht 5\' 7"  (1.702 m)  Wt 320 lb (145.151 kg)  BMI 50.12 kg/m2  SpO2 97% Patient with malodorous discharge. Awaiting wet prep.  4:20 PM Patient with moderate amount of hemoglobin on her UA.  They're few clue cells on wet prep consistent with bacterial vaginosis infection.  Patient is scheduled for a pelvic ultrasound on January 9. He has close outpatient followup with her OB/GYN provider.  Patient has fairly benign abdominal and pelvic exam.  She is only mild tenderness with insertion of the speculum.  I do not feel that she needs an ultrasound or further imaging at this time as she is already scheduled.  There is no bleeding or signs of cervicitis.  We'll discharge patient with alteration in her pain medication regimen.  She may followup with her OB/GYN provider.      Arthor Captain, PA-C 09/23/12 2130

## 2012-09-22 LAB — GC/CHLAMYDIA PROBE AMP
CT Probe RNA: NEGATIVE
GC Probe RNA: NEGATIVE

## 2012-09-23 NOTE — ED Provider Notes (Signed)
Medical screening examination/treatment/procedure(s) were performed by non-physician practitioner and as supervising physician I was immediately available for consultation/collaboration.  Geoffery Lyons, MD 09/23/12 2134

## 2012-10-20 ENCOUNTER — Encounter (HOSPITAL_BASED_OUTPATIENT_CLINIC_OR_DEPARTMENT_OTHER): Payer: Self-pay | Admitting: Emergency Medicine

## 2012-10-20 ENCOUNTER — Emergency Department (HOSPITAL_BASED_OUTPATIENT_CLINIC_OR_DEPARTMENT_OTHER)
Admission: EM | Admit: 2012-10-20 | Discharge: 2012-10-20 | Disposition: A | Discharge status: Home / Self Care | Payer: Medicaid Other | Attending: Emergency Medicine | Admitting: Emergency Medicine

## 2012-10-20 DIAGNOSIS — Z8614 Personal history of Methicillin resistant Staphylococcus aureus infection: Secondary | ICD-10-CM | POA: Insufficient documentation

## 2012-10-20 DIAGNOSIS — Z79899 Other long term (current) drug therapy: Secondary | ICD-10-CM | POA: Insufficient documentation

## 2012-10-20 DIAGNOSIS — Z90710 Acquired absence of both cervix and uterus: Secondary | ICD-10-CM | POA: Insufficient documentation

## 2012-10-20 DIAGNOSIS — L299 Pruritus, unspecified: Secondary | ICD-10-CM | POA: Insufficient documentation

## 2012-10-20 DIAGNOSIS — L509 Urticaria, unspecified: Secondary | ICD-10-CM

## 2012-10-20 DIAGNOSIS — Z8659 Personal history of other mental and behavioral disorders: Secondary | ICD-10-CM | POA: Insufficient documentation

## 2012-10-20 DIAGNOSIS — T7840XA Allergy, unspecified, initial encounter: Secondary | ICD-10-CM

## 2012-10-20 DIAGNOSIS — T4995XA Adverse effect of unspecified topical agent, initial encounter: Secondary | ICD-10-CM | POA: Insufficient documentation

## 2012-10-20 HISTORY — DX: Carrier or suspected carrier of methicillin resistant Staphylococcus aureus: Z22.322

## 2012-10-20 MED ORDER — PREDNISONE 20 MG PO TABS
40.0000 mg | ORAL_TABLET | Freq: Once | ORAL | Status: AC
  Administered 2012-10-20: 40 mg via ORAL
  Filled 2012-10-20: qty 2

## 2012-10-20 MED ORDER — DIPHENHYDRAMINE HCL 25 MG PO CAPS
50.0000 mg | ORAL_CAPSULE | Freq: Once | ORAL | Status: AC
  Administered 2012-10-20: 50 mg via ORAL
  Filled 2012-10-20: qty 2

## 2012-10-20 MED ORDER — FAMOTIDINE 20 MG PO TABS: 20.0000 mg | ORAL_TABLET | Freq: Two times a day (BID) | ORAL | Status: DC

## 2012-10-20 MED ORDER — PREDNISONE 20 MG PO TABS: 40.0000 mg | ORAL_TABLET | Freq: Every day | ORAL | Status: DC

## 2012-10-20 MED ORDER — DIPHENHYDRAMINE HCL 25 MG PO TABS
25.0000 mg | ORAL_TABLET | Freq: Four times a day (QID) | ORAL | Status: DC | PRN
Start: 2012-10-20 — End: 2014-02-10

## 2012-10-20 NOTE — ED Notes (Signed)
Hysterectomy yesterday.   Tonight has whelps and itching.  Denies difficulty breathing or swallowing

## 2012-10-20 NOTE — ED Notes (Signed)
Whelps noted to upper extremeties, abdomen and back, pt recently had hysterectomy, also reports being treated with bactrim for mrsa in right ear. Pt reports taking medications for at least 12 days now

## 2012-10-20 NOTE — ED Provider Notes (Signed)
History     CSN: [id number]  Arrival date & time 10/20/12  0018   First MD Initiated Contact with Patient 10/20/12 0044      Chief Complaint  Patient presents with  . Rash    (Consider location/radiation/quality/duration/timing/severity/associated sxs/prior treatment) HPI Comments: 42 year old female who presents approximately 24 hours after having a hysterectomy which was done laparoscopically. Her chief complaint is a rash which he stated started approximately 7:30 PM on January 31. This rash has been persistent, associated with redness and itching and is located all over her body and patches. It seems to move, is not in the same place at all times and is very itchy. She denies any swelling of her tongue, difficulty breathing or wheezing or coughing. She has never had a reaction like this in the past. She is unsure if she has been exposed to any topical exposures, medication exposures or food exposures but has not had any new exposures at home. She has been taking Bactrim for otitis externa related to a MRSA infection for the last 12 days and has not had this symptom in the past with Bactrim. Currently the symptoms are mild to moderate, persistent, nothing seems to make it better or worse.  Patient is a 42 y.o. female presenting with rash. The history is provided by the patient and the spouse.  Rash     Past Medical History  Diagnosis Date  . Depression   . MRSA (methicillin resistant staph aureus) culture positive     Past Surgical History  Procedure Date  . Cesarean section   . Tubal ligation   . Uterine ablasion   . Abdominal hysterectomy   . Knee arthroplasty   . Ear cyst excision     x3    No family history on file.  History  Substance Use Topics  . Smoking status: Not on file  . Smokeless tobacco: Not on file  . Alcohol Use: No    OB History    Grav Para Term Preterm Abortions TAB SAB Ect Mult Living                  Review of Systems  Skin: Positive for  rash.  All other systems reviewed and are negative.    Allergies  Penicillins and Tylenol  Home Medications   Current Outpatient Rx  Name  Route  Sig  Dispense  Refill  . ABILIFY PO   Oral   Take by mouth.         . TEGRETOL PO   Oral   Take by mouth.         Marland Kitchen PRISTIQ PO   Oral   Take by mouth.         Marland Kitchen DIPHENHYDRAMINE HCL 25 MG PO TABS   Oral   Take 1 tablet (25 mg total) by mouth every 6 (six) hours as needed for itching (Rash).   30 tablet   0   . FAMOTIDINE 20 MG PO TABS   Oral   Take 1 tablet (20 mg total) by mouth 2 (two) times daily.   30 tablet   0   . LASIX PO   Oral   Take by mouth.         Marland Kitchen GLUCOSE BLOOD VI STRP   Other   1 each by Other route as needed. Use as instructed         . VISTARIL PO   Oral   Take by mouth.         Marland Kitchen  LIDOCAINE 5 % EX PTCH   Transdermal   Place 1 patch onto the skin daily. Remove & Discard patch within 12 hours or as directed by MD         . MOBIC PO   Oral   Take by mouth.         . OXYCODONE HCL 15 MG PO TABS   Oral   Take 1 tablet (15 mg total) by mouth every 4 (four) hours as needed for pain.   24 tablet   0   . ROXICODONE PO   Oral   Take by mouth.         Marland Kitchen PREDNISONE 20 MG PO TABS   Oral   Take 2 tablets (40 mg total) by mouth daily.   10 tablet   0   . LYRICA PO   Oral   Take by mouth.         . SEROQUEL PO   Oral   Take by mouth.         . TRAMADOL HCL PO   Oral   Take by mouth.           BP 122/67  Pulse 107  Resp 18  SpO2 96%  Physical Exam  Nursing note and vitals reviewed. Constitutional: She appears well-developed and well-nourished. No distress.  HENT:  Head: Normocephalic and atraumatic.  Mouth/Throat: Oropharynx is clear and moist. No oropharyngeal exudate.       Mucous membranes are dry, no intraoral lesions  Eyes: Conjunctivae normal and EOM are normal. Pupils are equal, round, and reactive to light. Right eye exhibits no discharge. Left  eye exhibits no discharge. No scleral icterus.  Neck: Normal range of motion. Neck supple. No JVD present. No thyromegaly present.  Cardiovascular: Normal rate, regular rhythm, normal heart sounds and intact distal pulses.  Exam reveals no gallop and no friction rub.   No murmur heard. Pulmonary/Chest: Effort normal and breath sounds normal. No respiratory distress. She has no wheezes. She has no rales.  Abdominal: Soft. Bowel sounds are normal. She exhibits no distension and no mass. There is tenderness.       Minimal diffuse tenderness of the abdomen, no guarding, no masses, incisions are well approximated with staples, scattered urticarial lesions across the abdominal wall, no peritoneal signs  Musculoskeletal: Normal range of motion. She exhibits no edema and no tenderness.  Lymphadenopathy:    She has no cervical adenopathy.  Neurological: She is alert. Coordination normal.  Skin: Skin is warm and dry. Rash noted. No erythema.       Urticarial rash is scattered across the upper and lower extremities, lower back, upper neck, chest and abdomen.  Psychiatric: She has a normal mood and affect. Her behavior is normal.    ED Course  Procedures (including critical care time)  Labs Reviewed - No data to display No results found.   1. Allergic reaction   2. Urticaria       MDM  Overall the patient appears to have a mild to moderate allergic reaction. She is currently taking Roxicodone pain medication which she has used in the past as well. Her specific exposure is unknown at this time, I have thoroughly not through a long list of possible exposures of which she has not noted any correlation. She has dry mucous membranes but has been drinking plenty of fluids and does not feel dehydrated otherwise. She will be given Pepcid, Benadryl, prednisone his prescriptions after being treated with prednisone  and Benadryl in the emergency department. She appears stable for discharge, I have thoroughly  reviewed with her the indications for return including worsening allergic reaction symptoms. She is expressed her understanding. The patient appears stable for discharge at this time        Vida Roller, MD 10/20/12 [phone]

## 2012-10-22 ENCOUNTER — Encounter (HOSPITAL_BASED_OUTPATIENT_CLINIC_OR_DEPARTMENT_OTHER): Payer: Self-pay | Admitting: Emergency Medicine

## 2012-10-23 ENCOUNTER — Other Ambulatory Visit (HOSPITAL_COMMUNITY): Payer: Self-pay | Admitting: *Deleted

## 2012-10-23 DIAGNOSIS — F410 Panic disorder [episodic paroxysmal anxiety] without agoraphobia: Secondary | ICD-10-CM

## 2012-10-23 DIAGNOSIS — F39 Unspecified mood [affective] disorder: Secondary | ICD-10-CM

## 2012-10-25 MED ORDER — DESVENLAFAXINE SUCCINATE ER 100 MG PO TB24: 100.0000 mg | ORAL_TABLET | Freq: Every day | ORAL | Status: DC

## 2012-10-25 MED ORDER — ARIPIPRAZOLE 10 MG PO TABS: 10.0000 mg | ORAL_TABLET | Freq: Every day | ORAL | Status: DC

## 2012-10-25 MED ORDER — CARBAMAZEPINE ER 100 MG PO TB12: 100.0000 mg | ORAL_TABLET | Freq: Every day | ORAL | Status: DC

## 2012-11-13 ENCOUNTER — Ambulatory Visit (INDEPENDENT_AMBULATORY_CARE_PROVIDER_SITE_OTHER): Payer: Medicaid Other | Admitting: Physician Assistant

## 2012-11-13 DIAGNOSIS — F411 Generalized anxiety disorder: Secondary | ICD-10-CM

## 2012-11-13 DIAGNOSIS — F39 Unspecified mood [affective] disorder: Secondary | ICD-10-CM

## 2012-11-13 MED ORDER — QUETIAPINE FUMARATE 300 MG PO TABS: 300.0000 mg | ORAL_TABLET | Freq: Every day | ORAL | Status: DC

## 2012-11-13 NOTE — Progress Notes (Signed)
   [facility] Follow-up Outpatient Visit  Shaquinta Peruski [facility] 1970-11-22  Date: 11/13/2012   Subjective: Nury presents today to followup on her treatment for her anxiety and mood disorder. She underwent a total hysterectomy approximately 3 weeks ago. Her recovery is going well, but she is concerned that it is affecting her performance at school. She reports that her GPA dropped from a 4.0 to a 3.7.  She is currently taking a statistics class which is difficult for her. She reports that her mood is good, although she has some mood swings. She endorses some periods of down days when she wants to isolate. She reports that she will have about 3 days in row about 3-4 times a month where she feels down. She denies that she is crying. Her sleep is good. She reports that her anxiety is nonexistent. She denies any suicidal or homicidal ideation. She does endorse some auditory hallucinations of her name being called every few days. She denies any visual hallucinations.  There were no vitals filed for this visit.  Mental Status Examination  Appearance: Well groomed and nicely dressed Alert: Yes Attention: good  Cooperative: Yes Eye Contact: Good Speech: Clear and coherent Psychomotor Activity: Normal Memory/Concentration: Intact Oriented: person, place, time/date and situation Mood: Euthymic Affect: Congruent Thought Processes and Associations: Linear Fund of Knowledge: Good Thought Content: Auditory hallucinations Insight: Good Judgement: Good  Diagnosis: Mood disorder not otherwise specified, generalized anxiety disorder  Treatment Plan: We will continue her Abilify 10 mg daily, Tegretol-XR 100 mg each morning , Pristiq 100 mg daily, Vistaril 100 mg at bedtime as needed, Seroquel 300 mg at bedtime. She will return for followup in 3 months.   Tawnie Ehresman, PA-C

## 2012-11-14 ENCOUNTER — Emergency Department (HOSPITAL_BASED_OUTPATIENT_CLINIC_OR_DEPARTMENT_OTHER)
Admission: EM | Admit: 2012-11-14 | Discharge: 2012-11-14 | Disposition: A | Discharge status: Home / Self Care | Payer: Medicaid Other | Attending: Emergency Medicine | Admitting: Emergency Medicine

## 2012-11-14 ENCOUNTER — Encounter (HOSPITAL_BASED_OUTPATIENT_CLINIC_OR_DEPARTMENT_OTHER): Payer: Self-pay | Admitting: Emergency Medicine

## 2012-11-14 DIAGNOSIS — H53149 Visual discomfort, unspecified: Secondary | ICD-10-CM | POA: Insufficient documentation

## 2012-11-14 DIAGNOSIS — F319 Bipolar disorder, unspecified: Secondary | ICD-10-CM | POA: Insufficient documentation

## 2012-11-14 DIAGNOSIS — R110 Nausea: Secondary | ICD-10-CM | POA: Insufficient documentation

## 2012-11-14 DIAGNOSIS — G43909 Migraine, unspecified, not intractable, without status migrainosus: Secondary | ICD-10-CM | POA: Insufficient documentation

## 2012-11-14 DIAGNOSIS — Z79899 Other long term (current) drug therapy: Secondary | ICD-10-CM | POA: Insufficient documentation

## 2012-11-14 DIAGNOSIS — Z791 Long term (current) use of non-steroidal anti-inflammatories (NSAID): Secondary | ICD-10-CM | POA: Insufficient documentation

## 2012-11-14 DIAGNOSIS — Z8614 Personal history of Methicillin resistant Staphylococcus aureus infection: Secondary | ICD-10-CM | POA: Insufficient documentation

## 2012-11-14 DIAGNOSIS — IMO0001 Reserved for inherently not codable concepts without codable children: Secondary | ICD-10-CM | POA: Insufficient documentation

## 2012-11-14 DIAGNOSIS — F411 Generalized anxiety disorder: Secondary | ICD-10-CM | POA: Insufficient documentation

## 2012-11-14 MED ORDER — DIPHENHYDRAMINE HCL 50 MG/ML IJ SOLN
25.0000 mg | Freq: Once | INTRAMUSCULAR | Status: AC
  Administered 2012-11-14: 17:00:00 via INTRAMUSCULAR
  Filled 2012-11-14: qty 1

## 2012-11-14 MED ORDER — METOCLOPRAMIDE HCL 5 MG/ML IJ SOLN
10.0000 mg | Freq: Once | INTRAMUSCULAR | Status: AC
  Administered 2012-11-14: 10 mg via INTRAMUSCULAR
  Filled 2012-11-14: qty 2

## 2012-11-14 MED ORDER — KETOROLAC TROMETHAMINE 30 MG/ML IJ SOLN
60.0000 mg | Freq: Once | INTRAMUSCULAR | Status: AC
  Administered 2012-11-14: 60 mg via INTRAMUSCULAR
  Filled 2012-11-14: qty 2

## 2012-11-14 NOTE — ED Provider Notes (Signed)
History     CSN: [id number]  Arrival date & time 11/14/12  1605   First MD Initiated Contact with Patient 11/14/12 [phone]      Chief Complaint  Patient presents with  . Migraine    (Consider location/radiation/quality/duration/timing/severity/associated sxs/prior treatment) Patient is a 42 y.o. female presenting with migraines. The history is provided by the patient. No language interpreter was used.  Migraine This is a new problem. The current episode started in the past 7 days. The problem occurs intermittently. The problem has been unchanged. Associated symptoms include nausea. Pertinent negatives include no fever or vomiting. Exacerbated by: light. She has tried NSAIDs for the symptoms. The treatment provided no relief.    Past Medical History  Diagnosis Date  . Anxiety   . Bipolar 1 disorder   . Fibromyalgia   . Depression   . MRSA (methicillin resistant staph aureus) culture positive     Past Surgical History  Procedure Laterality Date  . Cesarean section    . Tubal ligation    . Uterine ablasion    . Abdominal hysterectomy    . Knee arthroplasty    . Ear cyst excision      x3    No family history on file.  History  Substance Use Topics  . Smoking status: Never Smoker   . Smokeless tobacco: Not on file  . Alcohol Use: No    OB History   Grav Para Term Preterm Abortions TAB SAB Ect Mult Living                  Review of Systems  Constitutional: Negative for fever.  Respiratory: Negative.   Cardiovascular: Negative.   Gastrointestinal: Positive for nausea. Negative for vomiting.    Allergies  Penicillins; Penicillins; Tylenol; and Tylenol  Home Medications   Current Outpatient Rx  Name  Route  Sig  Dispense  Refill  . ARIPiprazole (ABILIFY) 10 MG tablet   Oral   Take 1 tablet (10 mg total) by mouth daily.   30 tablet   5   . carbamazepine (TEGRETOL XR) 100 MG 12 hr tablet   Oral   Take 1 tablet (100 mg total) by mouth daily.   30  tablet   5   . desvenlafaxine (PRISTIQ) 100 MG 24 hr tablet   Oral   Take 1 tablet (100 mg total) by mouth daily.   30 tablet   5   . diphenhydrAMINE (BENADRYL) 25 MG tablet   Oral   Take 1 tablet (25 mg total) by mouth every 6 (six) hours as needed for itching (Rash).   30 tablet   0   . famotidine (PEPCID) 20 MG tablet   Oral   Take 1 tablet (20 mg total) by mouth 2 (two) times daily.   30 tablet   0   . Furosemide (LASIX PO)   Oral   Take by mouth.         Marland Kitchen glucose blood test strip   Other   1 each by Other route as needed. Use as instructed         . hydrOXYzine (ATARAX/VISTARIL) 50 MG tablet      Take two tablets by mouth at bedtime, and one tablet daily as needed   90 tablet   2   . HydrOXYzine Pamoate (VISTARIL PO)   Oral   Take by mouth.         Marland Kitchen ibuprofen (ADVIL,MOTRIN) 200 MG tablet   Oral  Take 600 mg by mouth every 6 (six) hours as needed. For pain         . lidocaine (LIDODERM) 5 %   Transdermal   Place 1 patch onto the skin daily. Remove & Discard patch within 12 hours or as directed by MD         . Meloxicam (MOBIC PO)   Oral   Take by mouth.         Marland Kitchen oxycodone (OXY-IR) 5 MG capsule   Oral   Take 5 mg by mouth 2 (two) times daily. For pain         . oxyCODONE (ROXICODONE) 15 MG immediate release tablet   Oral   Take 1 tablet (15 mg total) by mouth every 4 (four) hours as needed for pain.   24 tablet   0   . OxyCODONE HCl (ROXICODONE PO)   Oral   Take by mouth.         . predniSONE (DELTASONE) 20 MG tablet   Oral   Take 2 tablets (40 mg total) by mouth daily.   10 tablet   0   . Pregabalin (LYRICA PO)   Oral   Take by mouth.         . pregabalin (LYRICA) 225 MG capsule   Oral   Take 225 mg by mouth 2 (two) times daily.         . QUEtiapine (SEROQUEL) 300 MG tablet   Oral   Take 1 tablet (300 mg total) by mouth at bedtime.   30 tablet   4   . traMADol (ULTRAM) 50 MG tablet   Oral   Take 50 mg  by mouth every 6 (six) hours as needed. For pain         . TRAMADOL HCL PO   Oral   Take by mouth.           BP 163/84  Pulse 91  Temp(Src) 98.2 F (36.8 C) (Oral)  Resp 18  Ht 5\' 7"  (1.702 m)  Wt 340 lb (154.223 kg)  BMI 53.24 kg/m2  SpO2 99%  Physical Exam  Nursing note and vitals reviewed. Constitutional: She is oriented to person, place, and time. She appears well-developed and well-nourished.  HENT:  Head: Normocephalic and atraumatic.  Eyes: Conjunctivae and EOM are normal. Pupils are equal, round, and reactive to light.  Neck: Normal range of motion. Neck supple.  Cardiovascular: Normal rate and regular rhythm.   Pulmonary/Chest: Effort normal and breath sounds normal.  Musculoskeletal: Normal range of motion.  Neurological: She is alert and oriented to person, place, and time.  Skin: Skin is warm and dry.  Psychiatric: She has a normal mood and affect.    ED Course  Procedures (including critical care time)  Labs Reviewed - No data to display No results found.   1. Migraine       MDM  Pt is feeling better at this time and is ready to go home        Teressa Lower, NP 11/14/12 1754

## 2012-11-14 NOTE — ED Notes (Signed)
MIgraine started 1000 this morning.  Nausea and photophobia.

## 2012-11-14 NOTE — ED Notes (Signed)
C/o migraine h/a since 1430 today with nausea.  Denies vomiting.  She states this feels like a typical migraine.  She has taken 6 Aleve today without relief.  She has also taken Tramadol in the past for her migraines which worked well, up until 1 1/2 months ago.

## 2012-11-15 NOTE — ED Provider Notes (Signed)
History/physical exam/procedure(s) were performed by non-physician practitioner and as supervising physician I was immediately available for consultation/collaboration. I have reviewed all notes and am in agreement with care and plan.   Kambrie Eddleman S Cailey Trigueros, MD 11/15/12 1349 

## 2012-11-27 ENCOUNTER — Telehealth (HOSPITAL_COMMUNITY): Payer: Self-pay

## 2012-12-09 ENCOUNTER — Encounter (HOSPITAL_BASED_OUTPATIENT_CLINIC_OR_DEPARTMENT_OTHER): Payer: Self-pay | Admitting: *Deleted

## 2012-12-09 ENCOUNTER — Emergency Department (HOSPITAL_BASED_OUTPATIENT_CLINIC_OR_DEPARTMENT_OTHER): Payer: Medicaid Other

## 2012-12-09 ENCOUNTER — Emergency Department (HOSPITAL_BASED_OUTPATIENT_CLINIC_OR_DEPARTMENT_OTHER)
Admission: EM | Admit: 2012-12-09 | Discharge: 2012-12-09 | Disposition: A | Discharge status: Home / Self Care | Payer: Medicaid Other | Attending: Emergency Medicine | Admitting: Emergency Medicine

## 2012-12-09 DIAGNOSIS — Z79899 Other long term (current) drug therapy: Secondary | ICD-10-CM | POA: Insufficient documentation

## 2012-12-09 DIAGNOSIS — M25569 Pain in unspecified knee: Secondary | ICD-10-CM | POA: Insufficient documentation

## 2012-12-09 DIAGNOSIS — M25469 Effusion, unspecified knee: Secondary | ICD-10-CM | POA: Insufficient documentation

## 2012-12-09 DIAGNOSIS — Z8614 Personal history of Methicillin resistant Staphylococcus aureus infection: Secondary | ICD-10-CM | POA: Insufficient documentation

## 2012-12-09 DIAGNOSIS — F411 Generalized anxiety disorder: Secondary | ICD-10-CM | POA: Insufficient documentation

## 2012-12-09 DIAGNOSIS — Z8739 Personal history of other diseases of the musculoskeletal system and connective tissue: Secondary | ICD-10-CM | POA: Insufficient documentation

## 2012-12-09 DIAGNOSIS — M25461 Effusion, right knee: Secondary | ICD-10-CM

## 2012-12-09 DIAGNOSIS — F319 Bipolar disorder, unspecified: Secondary | ICD-10-CM | POA: Insufficient documentation

## 2012-12-09 DIAGNOSIS — M25561 Pain in right knee: Secondary | ICD-10-CM

## 2012-12-09 DIAGNOSIS — IMO0002 Reserved for concepts with insufficient information to code with codable children: Secondary | ICD-10-CM | POA: Insufficient documentation

## 2012-12-09 NOTE — ED Provider Notes (Addendum)
History     CSN: [id number]  Arrival date & time 12/09/12  4098   First MD Initiated Contact with Patient 12/09/12 [id number]      Chief Complaint  Patient presents with  . Knee Pain    (Consider location/radiation/quality/duration/timing/severity/associated sxs/prior treatment) HPI Comments: Patient was seen by her pain management specialist on Tuesday and had an injection in her right knee of synvisc. She's had this injection once before one year ago. She states the injection was very painful this time and the first day she was unable to walk which she said was similar to the last time she received a however she is now 5 days out and the pain is starting to get worse with swelling of the right knee. She now has significant pain with bending and straightening the knee as well as when she attempts to walk. She denies any fever or or recent injury to her knee.  Patient is a 42 y.o. female presenting with knee pain. The history is provided by the patient.  Knee Pain Location:  Knee Time since incident:  5 days Injury: no   Knee location:  R knee Pain details:    Quality:  Aching, shooting and throbbing   Radiates to:  R leg   Severity:  Severe   Onset quality:  Gradual   Duration:  5 days   Timing:  Constant   Progression:  Worsening Chronicity:  Recurrent Prior injury to area:  Yes Relieved by:  Nothing Worsened by:  Activity and bearing weight Ineffective treatments:  Ice and rest Associated symptoms: swelling   Associated symptoms: no back pain and no fever     Past Medical History  Diagnosis Date  . Anxiety   . Bipolar 1 disorder   . Fibromyalgia   . Depression   . MRSA (methicillin resistant staph aureus) culture positive     Past Surgical History  Procedure Laterality Date  . Cesarean section    . Tubal ligation    . Uterine ablasion    . Abdominal hysterectomy    . Knee arthroplasty    . Ear cyst excision      x3    No family history on file.  History   Substance Use Topics  . Smoking status: Never Smoker   . Smokeless tobacco: Not on file  . Alcohol Use: No    OB History   Grav Para Term Preterm Abortions TAB SAB Ect Mult Living                  Review of Systems  Constitutional: Negative for fever.  Musculoskeletal: Negative for back pain.  All other systems reviewed and are negative.    Allergies  Penicillins; Penicillins; Tylenol; and Tylenol  Home Medications   Current Outpatient Rx  Name  Route  Sig  Dispense  Refill  . ARIPiprazole (ABILIFY) 10 MG tablet   Oral   Take 1 tablet (10 mg total) by mouth daily.   30 tablet   5   . carbamazepine (TEGRETOL XR) 100 MG 12 hr tablet   Oral   Take 1 tablet (100 mg total) by mouth daily.   30 tablet   5   . desvenlafaxine (PRISTIQ) 100 MG 24 hr tablet   Oral   Take 1 tablet (100 mg total) by mouth daily.   30 tablet   5   . diphenhydrAMINE (BENADRYL) 25 MG tablet   Oral   Take 1 tablet (25 mg total) by  mouth every 6 (six) hours as needed for itching (Rash).   30 tablet   0   . famotidine (PEPCID) 20 MG tablet   Oral   Take 1 tablet (20 mg total) by mouth 2 (two) times daily.   30 tablet   0   . Furosemide (LASIX PO)   Oral   Take by mouth.         Marland Kitchen glucose blood test strip   Other   1 each by Other route as needed. Use as instructed         . hydrOXYzine (ATARAX/VISTARIL) 50 MG tablet      Take two tablets by mouth at bedtime, and one tablet daily as needed   90 tablet   2   . HydrOXYzine Pamoate (VISTARIL PO)   Oral   Take by mouth.         Marland Kitchen ibuprofen (ADVIL,MOTRIN) 200 MG tablet   Oral   Take 600 mg by mouth every 6 (six) hours as needed. For pain         . lidocaine (LIDODERM) 5 %   Transdermal   Place 1 patch onto the skin daily. Remove & Discard patch within 12 hours or as directed by MD         . Meloxicam (MOBIC PO)   Oral   Take by mouth.         Marland Kitchen oxycodone (OXY-IR) 5 MG capsule   Oral   Take 5 mg by mouth 2  (two) times daily. For pain         . oxyCODONE (ROXICODONE) 15 MG immediate release tablet   Oral   Take 1 tablet (15 mg total) by mouth every 4 (four) hours as needed for pain.   24 tablet   0   . OxyCODONE HCl (ROXICODONE PO)   Oral   Take by mouth.         . predniSONE (DELTASONE) 20 MG tablet   Oral   Take 2 tablets (40 mg total) by mouth daily.   10 tablet   0   . Pregabalin (LYRICA PO)   Oral   Take by mouth.         . pregabalin (LYRICA) 225 MG capsule   Oral   Take 225 mg by mouth 2 (two) times daily.         . QUEtiapine (SEROQUEL) 300 MG tablet   Oral   Take 1 tablet (300 mg total) by mouth at bedtime.   30 tablet   4   . traMADol (ULTRAM) 50 MG tablet   Oral   Take 50 mg by mouth every 6 (six) hours as needed. For pain         . TRAMADOL HCL PO   Oral   Take by mouth.           There were no vitals taken for this visit.  Physical Exam  Nursing note and vitals reviewed. Constitutional: She is oriented to person, place, and time. She appears well-developed and well-nourished. No distress.  HENT:  Head: Normocephalic and atraumatic.  Eyes: EOM are normal. Pupils are equal, round, and reactive to light.  Cardiovascular: Normal rate.   Pulmonary/Chest: Effort normal.  Musculoskeletal:       Right knee: She exhibits swelling and effusion. She exhibits normal range of motion and no erythema. Tenderness found. Medial joint line, lateral joint line and patellar tendon tenderness noted.  Mild warmth over the right knee.  No erythema.  No drainage and can range her knee but is painful.  No calf tenderness or swelling.  Normal popliteal, DP and PT pulses.  Neurological: She is alert and oriented to person, place, and time.  Skin: Skin is warm and dry. No rash noted. No erythema.  Psychiatric: She has a normal mood and affect. Her behavior is normal.    ED Course  Procedures (including critical care time)  Labs Reviewed - No data to  display Dg Knee Complete 4 Views Right  12/09/2012  *RADIOLOGY REPORT*  Clinical Data: Knee pain, swelling  RIGHT KNEE - COMPLETE 4+ VIEW  Comparison: None.  Findings: Four views of the right knee submitted.  No acute fracture or subluxation.  Mild narrowing of medial joint compartment.  Minimal spurring of medial tibial plateau.  Small joint effusion.  IMPRESSION: No acute fracture or subluxation.  Mild degenerative changes. Small joint effusion.   Original Report Authenticated By: Natasha Mead, M.D.      1. Right knee pain   2. Knee joint effusion, right       MDM   Patient with a history of chronic right knee arthritis with recent knee pain an injection of a type of chicken comb that was nonsteroidal 05 days ago by her pain specialist with worsening pain and swelling starting last night. She states the pain has gradually worsened since getting the injection to where she is now unable to walk and swelling started last night. Her knee is significantly swollen on the right compared to the left but full range of motion without any sign of septic joint. She has normal pulses and no calf swelling or pain. Feel this is most likely a reactive arthritis or reactions from the injection. Will obtain a plain film to ensure no other complications. Patient is otherwise well-appearing has no history of diabetes or fever.  She has pain medication at home as needed  9:16 AM  Plain film with small joint effusion but no acute fractures or subluxation. Findings discussed with the patient and her knee was wrapped with an Ace wrap and she was given crutches. She will ice, elevate and followup with her physician on Monday for further management.   Gwyneth Sprout, MD 12/09/12 1610  Gwyneth Sprout, MD 12/09/12 [phone]

## 2012-12-09 NOTE — ED Notes (Signed)
Patient c/o R knee pain and swelling, had an injection of synvisc on Tuesday for arthritis relief

## 2012-12-09 NOTE — ED Notes (Signed)
Patient sent to [facility] ER for crutches due to her weight. KK called [facility] to discuss requirement.

## 2012-12-10 ENCOUNTER — Emergency Department (HOSPITAL_BASED_OUTPATIENT_CLINIC_OR_DEPARTMENT_OTHER)
Admission: EM | Admit: 2012-12-10 | Discharge: 2012-12-10 | Discharge status: Against Medical Advice | Payer: Medicaid Other | Attending: Emergency Medicine | Admitting: Emergency Medicine

## 2012-12-10 ENCOUNTER — Encounter (HOSPITAL_BASED_OUTPATIENT_CLINIC_OR_DEPARTMENT_OTHER): Payer: Self-pay

## 2012-12-10 DIAGNOSIS — W1789XA Other fall from one level to another, initial encounter: Secondary | ICD-10-CM | POA: Insufficient documentation

## 2012-12-10 DIAGNOSIS — S99929A Unspecified injury of unspecified foot, initial encounter: Secondary | ICD-10-CM | POA: Insufficient documentation

## 2012-12-10 DIAGNOSIS — Y939 Activity, unspecified: Secondary | ICD-10-CM | POA: Insufficient documentation

## 2012-12-10 DIAGNOSIS — S8990XA Unspecified injury of unspecified lower leg, initial encounter: Secondary | ICD-10-CM | POA: Insufficient documentation

## 2012-12-10 DIAGNOSIS — Y929 Unspecified place or not applicable: Secondary | ICD-10-CM | POA: Insufficient documentation

## 2012-12-10 NOTE — ED Notes (Signed)
Pt states she fell out of car today and injury her R knee.  Pt states that she has swelling and was told there was fluid on her knee. PMS intact.  Pt presents with ace bandage intact.  Seen yesterday and dx with joint effusion

## 2013-02-07 ENCOUNTER — Encounter (HOSPITAL_COMMUNITY): Payer: Self-pay | Admitting: Physician Assistant

## 2013-02-07 ENCOUNTER — Ambulatory Visit (INDEPENDENT_AMBULATORY_CARE_PROVIDER_SITE_OTHER): Payer: Federal, State, Local not specified - Other | Admitting: Physician Assistant

## 2013-02-07 DIAGNOSIS — F411 Generalized anxiety disorder: Secondary | ICD-10-CM

## 2013-02-07 DIAGNOSIS — F39 Unspecified mood [affective] disorder: Secondary | ICD-10-CM

## 2013-02-07 NOTE — Progress Notes (Signed)
   [facility] Follow-up Outpatient Visit  Debra Cardenas 05-16-1971  Date: 02/07/2013   Subjective: Debra Cardenas presents today to followup on her treatment for anxiety and depression. She reports that she stopped taking the Abilify about 1-1/2 months ago, and does not feel any different. She also is no longer taking the Vistaril as she feels she does not need it. She reports that her sleep is good on the Seroquel. Her appetite is good, and she is eating healthy for the most part. She continues to have some mood swings, and rare auditory hallucinations. She denies any suicidal or homicidal ideation. She denies any visual hallucinations.  [name] discussed several life stressors, including the fact that she continues to date the man that she began seeing 9 months ago, but she is beginning to have regrets. She is also taking 2 online classes at [employer]. Her daughter is graduating from high school next month. She has been put on atenolol for hypertension that has been elevated since her hysterectomy. Her Dr. has expressed some concern about her weight, and questions the use of the Seroquel. [name] asked if there was a medication that we could switch to watch her blood pressure is under control.  There were no vitals filed for this visit.  Mental Status Examination  Appearance: Well groomed and nicely dressed Alert: Yes Attention: good  Cooperative: Yes Eye Contact: Good Speech: Clear and coherent with a regular rate and rhythm and normal volume. Somewhat hyperverbal. 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 She will return for followup in 2 months and we will consider changing the Seroquel to Geodon.  Aemon Koeller, PA-C

## 2013-04-09 ENCOUNTER — Encounter (HOSPITAL_COMMUNITY): Payer: Self-pay | Admitting: Physician Assistant

## 2013-04-09 ENCOUNTER — Ambulatory Visit (INDEPENDENT_AMBULATORY_CARE_PROVIDER_SITE_OTHER): Payer: Federal, State, Local not specified - Other | Admitting: Physician Assistant

## 2013-04-09 VITALS — BP 133/86 | HR 103 | Ht 67.0 in | Wt 333.2 lb

## 2013-04-09 DIAGNOSIS — F411 Generalized anxiety disorder: Secondary | ICD-10-CM

## 2013-04-09 DIAGNOSIS — F39 Unspecified mood [affective] disorder: Secondary | ICD-10-CM

## 2013-04-09 MED ORDER — ZIPRASIDONE HCL 40 MG PO CAPS: ORAL_CAPSULE | ORAL | Status: DC

## 2013-04-09 NOTE — Progress Notes (Signed)
[facility] [address] Progress Note  Debra Cardenas [place] [id number] 42 y.o.  04/09/2013 2:48 PM  Chief Complaint: mood swings  History of Present Illness: Debra Cardenas presents today accompanied by her boyfriend to followup on her treatment for mood disorder and anxiety. She reports that overall she is doing well. She complains of having mood swings, and describes them as waking up in the morning feeling fine, but by lunchtime she is irritable. When asked she states that her periods of irritability can last from one to 3 weeks. She reports that she has periods of euthymia that last about 2 weeks. She will occasionally have a day where she is "high." She asked about possibly changing the Seroquel to Geodon as we had discussed at her last appointment because she is concerned about her weight. She also reports that she does not take the Tegretol consistently because she feels that it makes her sleepy during the day.   Suicidal Ideation: No Plan Formed: No Patient has means to carry out plan: No  Homicidal Ideation: No Plan Formed: No Patient has means to carry out plan: No  Review of Systems: Psychiatric: Agitation: Yes Hallucination: No Depressed Mood: Yes Insomnia: Yes Hypersomnia: No Altered Concentration: No Feels Worthless: No Grandiose Ideas: No Belief In Special Powers: No New/Increased Substance Abuse: No Compulsions: No  Neurologic: Headache: No Seizure: No Paresthesias: No  Past Medical History: fibromyalgia, obesity, back pain  Outpatient Encounter Prescriptions as of 04/09/2013  Medication Sig Dispense Refill  . carbamazepine (TEGRETOL XR) 100 MG 12 hr tablet Take 1 tablet (100 mg total) by mouth daily.  30 tablet  5  . desvenlafaxine (PRISTIQ) 100 MG 24 hr tablet Take 1 tablet (100 mg total) by mouth daily.  30 tablet  5  . diphenhydrAMINE (BENADRYL) 25 MG tablet Take 1 tablet (25 mg total) by mouth every 6 (six) hours as needed for itching (Rash).  30 tablet  0  .  famotidine (PEPCID) 20 MG tablet Take 1 tablet (20 mg total) by mouth 2 (two) times daily.  30 tablet  0  . Furosemide (LASIX PO) Take by mouth.      Marland Kitchen glucose blood test strip 1 each by Other route as needed. Use as instructed      . hydrOXYzine (ATARAX/VISTARIL) 50 MG tablet Take two tablets by mouth at bedtime, and one tablet daily as needed  90 tablet  2  . HydrOXYzine Pamoate (VISTARIL PO) Take by mouth.      Marland Kitchen ibuprofen (ADVIL,MOTRIN) 200 MG tablet Take 600 mg by mouth every 6 (six) hours as needed. For pain      . lidocaine (LIDODERM) 5 % Place 1 patch onto the skin daily. Remove & Discard patch within 12 hours or as directed by MD      . Meloxicam (MOBIC PO) Take by mouth.      Marland Kitchen oxycodone (OXY-IR) 5 MG capsule Take 5 mg by mouth 2 (two) times daily. For pain      . oxyCODONE (ROXICODONE) 15 MG immediate release tablet Take 1 tablet (15 mg total) by mouth every 4 (four) hours as needed for pain.  24 tablet  0  . OxyCODONE HCl (ROXICODONE PO) Take by mouth.      . predniSONE (DELTASONE) 20 MG tablet Take 2 tablets (40 mg total) by mouth daily.  10 tablet  0  . Pregabalin (LYRICA PO) Take by mouth.      . pregabalin (LYRICA) 225 MG capsule Take 225 mg by  mouth 2 (two) times daily.      . QUEtiapine (SEROQUEL) 300 MG tablet Take 1 tablet (300 mg total) by mouth at bedtime.  30 tablet  4  . traMADol (ULTRAM) 50 MG tablet Take 50 mg by mouth every 6 (six) hours as needed. For pain      . TRAMADOL HCL PO Take by mouth.      . ziprasidone (GEODON) 40 MG capsule Take one capsule after supper for one week, then take two capsules after supper  60 capsule  0  . [DISCONTINUED] ARIPiprazole (ABILIFY) 10 MG tablet Take 1 tablet (10 mg total) by mouth daily.  30 tablet  5   No facility-administered encounter medications on file as of 04/09/2013.    Past Psychiatric History/Hospitalization(s): Anxiety: Yes Bipolar Disorder: Yes Depression: Yes Mania: Yes Psychosis: No Schizophrenia:  No Personality Disorder: No Hospitalization for psychiatric illness: No History of Electroconvulsive Shock Therapy: No Prior Suicide Attempts: No  Physical Exam: Constitutional:  BP 133/86  Pulse 103  Ht 5\' 7"  (1.702 m)  Wt 333 lb 3.2 oz (151.139 kg)  BMI 52.17 kg/m2  General Appearance: alert, oriented, no acute distress, well nourished and well dressed  Musculoskeletal: Strength & Muscle Tone: within normal limits Gait & Station: normal Patient leans: N/A  Psychiatric: Speech (describe rate, volume, coherence, spontaneity, and abnormalities if any): clear and coherent at a regular rate and rhythm and normal volume  Thought Process (describe rate, content, abstract reasoning, and computation): within normal limits  Associations: Intact  Thoughts: normal  Mental Status: Orientation: oriented to person, place, time/date and situation Mood & Affect: normal affect and anxiety Attention Span & Concentration: intact  Medical Decision Making (Choose Three): Established Problem, Stable/Improving (1), Review of Psycho-Social Stressors (1) and Review of New Medication or Change in Dosage (2)  Assessment: Axis I: mood disorder not otherwise specified, generalized anxiety disorder  Axis II: deferred  Axis III: fibromyalgia, obesity, chronic back pain  Axis IV: moderate  Axis V: 60   Plan: we will change her Seroquel to Geodon by way of cross taper. For one week she will take Seroquel 300 mg at bedtime, and Geodon 40 mg after supper. After that week she will increase her Geodon to 80 mg after supper and discontinue Seroquel. She will return for followup in approximately one month. At that time we will assess the effectiveness of Geodon and consider changing her Tegretol to Lamictal.  Debra Medellin, PA-C 04/09/2013

## 2013-04-19 ENCOUNTER — Telehealth (HOSPITAL_COMMUNITY): Payer: Self-pay

## 2013-04-19 NOTE — Telephone Encounter (Signed)
9:50am 04/19/13  Patient called stating that she is having problems with medication (GEODON) please give patient a call.Marland KitchenMarguerite Cardenas

## 2013-04-23 ENCOUNTER — Telehealth (HOSPITAL_COMMUNITY): Payer: Self-pay | Admitting: Physician Assistant

## 2013-04-23 NOTE — Telephone Encounter (Signed)
Attempted to call patient at number listed for home and mobile. No answer and mailbox has not been set up.

## 2013-04-23 NOTE — Telephone Encounter (Signed)
Patient reports way of telephone that Geodon was causing nausea and vomiting, and she is unable to sleep. Will discontinue Geodon and resume Seroquel at 300 mg at bedtime. Patient is to call before she runs out of Seroquel to report as to whether her nausea has diminished, and whether her sleep has resumed. We will consider other medications for sleep and bipolar. We may need to consider atypical antipsychotics do to patient's obesity and propensity for diabetes.

## 2013-04-30 ENCOUNTER — Other Ambulatory Visit (HOSPITAL_COMMUNITY): Payer: Self-pay | Admitting: Physician Assistant

## 2013-05-16 ENCOUNTER — Ambulatory Visit (HOSPITAL_COMMUNITY): Payer: Self-pay | Admitting: Physician Assistant

## 2013-05-27 ENCOUNTER — Other Ambulatory Visit (HOSPITAL_COMMUNITY): Payer: Self-pay | Admitting: *Deleted

## 2013-05-27 DIAGNOSIS — F410 Panic disorder [episodic paroxysmal anxiety] without agoraphobia: Secondary | ICD-10-CM

## 2013-05-27 DIAGNOSIS — F39 Unspecified mood [affective] disorder: Secondary | ICD-10-CM

## 2013-05-27 MED ORDER — DESVENLAFAXINE SUCCINATE ER 100 MG PO TB24: 100.0000 mg | ORAL_TABLET | Freq: Every day | ORAL | Status: DC

## 2013-05-27 MED ORDER — QUETIAPINE FUMARATE 300 MG PO TABS: ORAL_TABLET | ORAL | Status: DC

## 2013-05-27 MED ORDER — CARBAMAZEPINE ER 100 MG PO TB12: 100.0000 mg | ORAL_TABLET | Freq: Every day | ORAL | Status: DC

## 2013-06-10 ENCOUNTER — Ambulatory Visit (HOSPITAL_COMMUNITY): Payer: Self-pay | Admitting: Physician Assistant

## 2013-06-11 ENCOUNTER — Ambulatory Visit (INDEPENDENT_AMBULATORY_CARE_PROVIDER_SITE_OTHER): Payer: Medicaid Other | Admitting: Physician Assistant

## 2013-06-11 VITALS — BP 142/71 | HR 89 | Ht 67.0 in | Wt 336.2 lb

## 2013-06-11 DIAGNOSIS — F39 Unspecified mood [affective] disorder: Secondary | ICD-10-CM

## 2013-06-11 DIAGNOSIS — F411 Generalized anxiety disorder: Secondary | ICD-10-CM

## 2013-06-11 MED ORDER — TRAZODONE HCL 50 MG PO TABS: 50.0000 mg | ORAL_TABLET | Freq: Every day | ORAL | Status: DC

## 2013-06-12 ENCOUNTER — Encounter (HOSPITAL_COMMUNITY): Payer: Self-pay | Admitting: Physician Assistant

## 2013-06-12 NOTE — Progress Notes (Signed)
[facility] [address] Progress Note  Debra Cardenas [place] [id number] 42 y.o.  06/11/2013 4:53 PM  Chief Complaint: Anxiety, depression  History of Present Illness:  Debra Cardenas presents today to followup on her treatment for anxiety and mood disorder. At her last appointment we attempted to cross taper her Seroquel to Geodon. She was unable to tolerate the Geodon as it caused nausea and vomiting. She is back on Seroquel 300 mg at bedtime. Reports that it is taking her 3 hours to fall asleep, she sleeps for about 8 hours but says that it is not restful. She reports that she feels tired all the time. She also complains of having a busy mind as well as depressed mood. She is less secure double and she has had no panic attacks. She is taking the Tegretol at nighttime only, and the Vistaril as needed. She reports that she is preparing to have periodic surgery possibly in the spring. She is going for a sleep study in December.  Suicidal Ideation: No Plan Formed: No Patient has means to carry out plan: No  Homicidal Ideation: No Plan Formed: No Patient has means to carry out plan: No  Review of Systems: Psychiatric: Agitation: No Hallucination: No Depressed Mood: Yes Insomnia: Yes Hypersomnia: No Altered Concentration: No Feels Worthless: No Grandiose Ideas: No Belief In Special Powers: No New/Increased Substance Abuse: No Compulsions: No  Neurologic: Headache: No Seizure: No Paresthesias: No  Past Medical History: fibromyalgia, obesity, back pain  Outpatient Encounter Prescriptions as of 06/11/2013  Medication Sig Dispense Refill  . carbamazepine (TEGRETOL XR) 100 MG 12 hr tablet Take 1 tablet (100 mg total) by mouth daily.  30 tablet  0  . desvenlafaxine (PRISTIQ) 100 MG 24 hr tablet Take 1 tablet (100 mg total) by mouth daily.  30 tablet  0  . diphenhydrAMINE (BENADRYL) 25 MG tablet Take 1 tablet (25 mg total) by mouth every 6 (six) hours as needed for itching (Rash).  30 tablet  0   . famotidine (PEPCID) 20 MG tablet Take 1 tablet (20 mg total) by mouth 2 (two) times daily.  30 tablet  0  . Furosemide (LASIX PO) Take by mouth.      Marland Kitchen glucose blood test strip 1 each by Other route as needed. Use as instructed      . hydrOXYzine (ATARAX/VISTARIL) 50 MG tablet Take two tablets by mouth at bedtime, and one tablet daily as needed  90 tablet  2  . HydrOXYzine Pamoate (VISTARIL PO) Take by mouth.      Marland Kitchen ibuprofen (ADVIL,MOTRIN) 200 MG tablet Take 600 mg by mouth every 6 (six) hours as needed. For pain      . lidocaine (LIDODERM) 5 % Place 1 patch onto the skin daily. Remove & Discard patch within 12 hours or as directed by MD      . Meloxicam (MOBIC PO) Take by mouth.      Marland Kitchen oxycodone (OXY-IR) 5 MG capsule Take 5 mg by mouth 2 (two) times daily. For pain      . oxyCODONE (ROXICODONE) 15 MG immediate release tablet Take 1 tablet (15 mg total) by mouth every 4 (four) hours as needed for pain.  24 tablet  0  . OxyCODONE HCl (ROXICODONE PO) Take by mouth.      . predniSONE (DELTASONE) 20 MG tablet Take 2 tablets (40 mg total) by mouth daily.  10 tablet  0  . Pregabalin (LYRICA PO) Take by mouth.      Marland Kitchen  pregabalin (LYRICA) 225 MG capsule Take 225 mg by mouth 2 (two) times daily.      . QUEtiapine (SEROQUEL) 300 MG tablet TAKE 1 TABLET BY MOUTH EVERY NIGHT AT BEDTIME  30 tablet  0  . traMADol (ULTRAM) 50 MG tablet Take 50 mg by mouth every 6 (six) hours as needed. For pain      . TRAMADOL HCL PO Take by mouth.      . traZODone (DESYREL) 50 MG tablet Take 1 tablet (50 mg total) by mouth at bedtime.  30 tablet  0   No facility-administered encounter medications on file as of 06/11/2013.    Past Psychiatric History/Hospitalization(s): Anxiety: Yes Bipolar Disorder: Yes Depression: Yes Mania: Yes Psychosis: No Schizophrenia: No Personality Disorder: No Hospitalization for psychiatric illness: No History of Electroconvulsive Shock Therapy: No Prior Suicide Attempts:  No  Physical Exam: Constitutional:  BP 142/71  Pulse 89  Ht 5\' 7"  (1.702 m)  Wt 336 lb 3.2 oz (152.499 kg)  BMI 52.64 kg/m2  General Appearance: alert, oriented, no acute distress, well nourished and well dressed  Musculoskeletal: Strength & Muscle Tone: within normal limits Gait & Station: normal Patient leans: N/A  Psychiatric: Speech (describe rate, volume, coherence, spontaneity, and abnormalities if any): Clear and coherent had a regular rate and rhythm and normal volume  Thought Process (describe rate, content, abstract reasoning, and computation): Within normal limits  Associations: Intact  Thoughts: normal  Mental Status: Orientation: oriented to person, place, time/date and situation Mood & Affect: anxiety Attention Span & Concentration: Intact  Medical Decision Making (Choose Three): Review of Psycho-Social Stressors (1), Established Problem, Worsening (2), Review of Medication Regimen & Side Effects (2) and Review of New Medication or Change in Dosage (2)  Assessment:  Axis I: mood disorder not otherwise specified, generalized anxiety disorder  Axis II: deferred  Axis III: fibromyalgia, obesity, chronic back pain  Axis IV: moderate  Axis V: 60  Plan: We will start her on trazodone 50-100 mg at bedtime, and she will return for followup in 2 weeks. We will continue her Seroquel 300 mg at bedtime as well as Tegretol XL 100 mg at bedtime and Vistaril 50 mg at bedtime as needed.  Lisle Skillman, PA-C 06/12/2013

## 2013-06-30 ENCOUNTER — Other Ambulatory Visit (HOSPITAL_COMMUNITY): Payer: Self-pay | Admitting: Physician Assistant

## 2013-07-01 ENCOUNTER — Telehealth (HOSPITAL_COMMUNITY): Payer: Self-pay | Admitting: *Deleted

## 2013-07-01 NOTE — Telephone Encounter (Signed)
Pt called to ask about refills of medication Trazodone and Seroquel. States she has appointment 10/14 but is completely out of Seroquel. Can do without Trazodone. Saw Jorje Guild 2 weeks ago. Review of chart shows refill of Seroquel 06/30/13 by Jorje Guild. Called patient to let her know of refill on file. Pt thankful.

## 2013-07-01 NOTE — Telephone Encounter (Signed)
Pt states she has appointment 07/02/13 with Jorje Guild but has ran out of Seroquel and not slept all weekend. Asking for a refill. Also states she only has 6 Trazodone left but will discuss this with Hessie Diener at appointment to obtain a refill.

## 2013-07-02 ENCOUNTER — Ambulatory Visit (INDEPENDENT_AMBULATORY_CARE_PROVIDER_SITE_OTHER): Payer: Medicaid Other | Admitting: Physician Assistant

## 2013-07-02 VITALS — BP 138/78 | HR 96 | Ht 68.0 in | Wt 339.0 lb

## 2013-07-02 DIAGNOSIS — F411 Generalized anxiety disorder: Secondary | ICD-10-CM

## 2013-07-02 DIAGNOSIS — F39 Unspecified mood [affective] disorder: Secondary | ICD-10-CM

## 2013-07-02 MED ORDER — TRAZODONE HCL 50 MG PO TABS: 50.0000 mg | ORAL_TABLET | Freq: Every day | ORAL | Status: DC

## 2013-07-04 ENCOUNTER — Encounter (HOSPITAL_COMMUNITY): Payer: Self-pay | Admitting: Physician Assistant

## 2013-07-04 NOTE — Progress Notes (Signed)
   [facility] Follow-up Outpatient Visit  Debra Cardenas [facility] [birth year]  Date: 07/02/2013    Subjective: Hend presents today to follow up on her treatment for anxiety and mood disorder.  She began taking the trazodone prescribed at her last appointment, and finds that she sleeps more restfully with 50 mg and Seroquel with it.  She has begun to have stomach pain when she eats, and she has begun taking Prilosec. Her mood has been stable.  Her anxiety is well managed. She had her boyfriend move out as she wants to have her own space. She is happy with her decision.  She denies any SI/HI or AVH.  Filed Vitals:   07/02/13 1658  BP: 138/78  Pulse: 96    Mental Status Examination  Appearance: Well groomed and dressed  Alert: Yes Attention: good  Cooperative: Yes Eye Contact: Good Speech: Clear and coherent Psychomotor Activity: Normal Memory/Concentration: Intact  Oriented: person, place, time/date and situation Mood: Euthymic Affect: Congruent Thought Processes and Associations: Linear Fund of Knowledge: Good Thought Content: Normal Insight: Good Judgement: Good  Diagnosis: Mood disorder not otherwise specified, generalized anxiety disorder   Treatment Plan: We will have her take trazodone 100 mg at bedtime with Seroquel 150 mg. We will continue Tegretol XL 100 mg at bedtime and Vistaril 50 mg at bedtime as needed. She will return for followup in 2 months.   Desmen Schoffstall, PA-C

## 2013-07-17 ENCOUNTER — Ambulatory Visit (HOSPITAL_COMMUNITY): Payer: Self-pay | Admitting: Psychiatry

## 2013-07-18 ENCOUNTER — Emergency Department (HOSPITAL_BASED_OUTPATIENT_CLINIC_OR_DEPARTMENT_OTHER): Payer: Medicaid Other

## 2013-07-18 ENCOUNTER — Encounter (HOSPITAL_BASED_OUTPATIENT_CLINIC_OR_DEPARTMENT_OTHER): Payer: Self-pay | Admitting: Emergency Medicine

## 2013-07-18 ENCOUNTER — Emergency Department (HOSPITAL_BASED_OUTPATIENT_CLINIC_OR_DEPARTMENT_OTHER)
Admission: EM | Admit: 2013-07-18 | Discharge: 2013-07-18 | Disposition: A | Discharge status: Home / Self Care | Payer: Medicaid Other | Attending: Emergency Medicine | Admitting: Emergency Medicine

## 2013-07-18 DIAGNOSIS — F319 Bipolar disorder, unspecified: Secondary | ICD-10-CM | POA: Insufficient documentation

## 2013-07-18 DIAGNOSIS — Z79899 Other long term (current) drug therapy: Secondary | ICD-10-CM | POA: Insufficient documentation

## 2013-07-18 DIAGNOSIS — Z791 Long term (current) use of non-steroidal anti-inflammatories (NSAID): Secondary | ICD-10-CM | POA: Insufficient documentation

## 2013-07-18 DIAGNOSIS — M25561 Pain in right knee: Secondary | ICD-10-CM

## 2013-07-18 DIAGNOSIS — F411 Generalized anxiety disorder: Secondary | ICD-10-CM | POA: Insufficient documentation

## 2013-07-18 DIAGNOSIS — Z9889 Other specified postprocedural states: Secondary | ICD-10-CM | POA: Insufficient documentation

## 2013-07-18 DIAGNOSIS — M1990 Unspecified osteoarthritis, unspecified site: Secondary | ICD-10-CM | POA: Insufficient documentation

## 2013-07-18 DIAGNOSIS — IMO0002 Reserved for concepts with insufficient information to code with codable children: Secondary | ICD-10-CM | POA: Insufficient documentation

## 2013-07-18 DIAGNOSIS — E669 Obesity, unspecified: Secondary | ICD-10-CM | POA: Insufficient documentation

## 2013-07-18 DIAGNOSIS — M25569 Pain in unspecified knee: Secondary | ICD-10-CM | POA: Insufficient documentation

## 2013-07-18 DIAGNOSIS — IMO0001 Reserved for inherently not codable concepts without codable children: Secondary | ICD-10-CM | POA: Insufficient documentation

## 2013-07-18 DIAGNOSIS — Z8614 Personal history of Methicillin resistant Staphylococcus aureus infection: Secondary | ICD-10-CM | POA: Insufficient documentation

## 2013-07-18 MED ORDER — OXYCODONE HCL 5 MG PO TABS: 5.0000 mg | ORAL_TABLET | Freq: Four times a day (QID) | ORAL | Status: DC | PRN

## 2013-07-18 MED ORDER — OXYCODONE HCL 5 MG PO TABS
5.0000 mg | ORAL_TABLET | Freq: Once | ORAL | Status: AC
  Administered 2013-07-18: 5 mg via ORAL
  Filled 2013-07-18: qty 1

## 2013-07-18 MED ORDER — IBUPROFEN 800 MG PO TABS: 800.0000 mg | ORAL_TABLET | Freq: Three times a day (TID) | ORAL | Status: DC

## 2013-07-18 NOTE — ED Notes (Signed)
Pain to right knee x 2-3 days-denies injury

## 2013-07-18 NOTE — ED Provider Notes (Signed)
This chart was scribed for Debra Maw Dale Strausser, DO by Ardelia Mems, ED Scribe. This patient was seen in room MH09/MH09 and the patient's care was started at 7:18 PM.  TIME SEEN: 7:18 PM  CHIEF COMPLAINT: Right knee pain  HPI:   HPI Comments: Debra Cardenas is a 42 y.o. Female with a history of obesity, fibromyalgia and remote h/o right laproscopic knee surgery who presents to the Emergency Department complaining of intermittent, moderate right knee pain over the past 2-3 days, described as "sharp and as dull". She denies any recent injury to have caused the onset of this pain. She states that she has been unable to bear all of her weight on the knee, and she states that walking worsens her pain as well as flexion of the knee.  She states that she thinks she needs an injection in her knee, as this has offered her relief in the past. She is no longer followed by an orthopedist.  She denies numbness, hip pain, back pain, fever, chills, redness or warmth of this joint.   ROS: See HPI Constitutional: no fever, no chills Eyes: no drainage  ENT: no runny nose   Cardiovascular:  no chest pain  Resp: no SOB  GI: no vomiting, +abdominal pain GU: no dysuria Integumentary: no rash  Allergy: no hives  Musculoskeletal: no leg swelling, +right knee pain, no hip pain, no back pain Neurological: no slurred speech ROS otherwise negative  PAST MEDICAL HISTORY/PAST SURGICAL HISTORY:  Past Medical History  Diagnosis Date  . Anxiety   . Bipolar 1 disorder   . Fibromyalgia   . Depression   . MRSA (methicillin resistant staph aureus) culture positive    MEDICATIONS:  Prior to Admission medications   Medication Sig Start Date End Date Taking? Authorizing Provider  OMEPRAZOLE PO Take by mouth.   Yes Historical Provider, MD  carbamazepine (TEGRETOL XR) 100 MG 12 hr tablet Take 1 tablet (100 mg total) by mouth daily. 05/27/13 05/27/14  Jorje Guild, PA-C  desvenlafaxine (PRISTIQ) 100 MG 24 hr tablet Take 1  tablet (100 mg total) by mouth daily. 05/27/13 05/27/14  Jorje Guild, PA-C  diphenhydrAMINE (BENADRYL) 25 MG tablet Take 1 tablet (25 mg total) by mouth every 6 (six) hours as needed for itching (Rash). 10/20/12   Vida Roller, MD  famotidine (PEPCID) 20 MG tablet Take 1 tablet (20 mg total) by mouth 2 (two) times daily. 10/20/12   Vida Roller, MD  Furosemide (LASIX PO) Take by mouth.    Historical Provider, MD  glucose blood test strip 1 each by Other route as needed. Use as instructed    Historical Provider, MD  hydrOXYzine (ATARAX/VISTARIL) 50 MG tablet Take two tablets by mouth at bedtime, and one tablet daily as needed 08/07/12   Jorje Guild, PA-C  HydrOXYzine Pamoate (VISTARIL PO) Take by mouth.    Historical Provider, MD  ibuprofen (ADVIL,MOTRIN) 200 MG tablet Take 600 mg by mouth every 6 (six) hours as needed. For pain    Historical Provider, MD  lidocaine (LIDODERM) 5 % Place 1 patch onto the skin daily. Remove & Discard patch within 12 hours or as directed by MD    Historical Provider, MD  Meloxicam (MOBIC PO) Take by mouth.    Historical Provider, MD  oxycodone (OXY-IR) 5 MG capsule Take 5 mg by mouth 2 (two) times daily. For pain    Historical Provider, MD  oxyCODONE (ROXICODONE) 15 MG immediate release tablet Take 1 tablet (15 mg  total) by mouth every 4 (four) hours as needed for pain. 09/21/12   Arthor Captain, PA-C  OxyCODONE HCl (ROXICODONE PO) Take by mouth.    Historical Provider, MD  predniSONE (DELTASONE) 20 MG tablet Take 2 tablets (40 mg total) by mouth daily. 10/20/12   Vida Roller, MD  Pregabalin (LYRICA PO) Take by mouth.    Historical Provider, MD  pregabalin (LYRICA) 225 MG capsule Take 225 mg by mouth 2 (two) times daily.    Historical Provider, MD  QUEtiapine (SEROQUEL) 300 MG tablet TAKE 1 TABLET BY MOUTH EVERY NIGHT AT BEDTIME 06/30/13   Jorje Guild, PA-C  traMADol (ULTRAM) 50 MG tablet Take 50 mg by mouth every 6 (six) hours as needed. For pain    Historical Provider, MD   TRAMADOL HCL PO Take by mouth.    Historical Provider, MD  traZODone (DESYREL) 50 MG tablet Take 1-2 tablets (50-100 mg total) by mouth at bedtime. 07/02/13   Jorje Guild, PA-C    ALLERGIES:  Allergies  Allergen Reactions  . Penicillins Hives    Chest pain   . Penicillins   . Tylenol [Acetaminophen]     Massive migraine  . Tylenol [Acetaminophen]     Migraine headache   SOCIAL HISTORY:  History  Substance Use Topics  . Smoking status: Never Smoker   . Smokeless tobacco: Not on file  . Alcohol Use: No    FAMILY HISTORY: No family history on file.  EXAM: BP 107/69  Pulse 89  Temp(Src) 99 F (37.2 C) (Oral)  Resp 18  Ht 5\' 7"  (1.702 m)  Wt 337 lb (152.862 kg)  BMI 52.77 kg/m2  SpO2 97% CONSTITUTIONAL: Alert and oriented and responds appropriately to questions. Well-appearing; well-nourished HEAD: Normocephalic EYES: Conjunctivae clear, PERRL ENT: normal nose; no rhinorrhea; moist mucous membranes; pharynx without lesions noted NECK: Supple, no meningismus, no LAD  CARD: RRR; S1 and S2 appreciated; no murmurs, no clicks, no rubs, no gallops RESP: Normal chest excursion without splinting or tachypnea; breath sounds clear and equal bilaterally; no wheezes, no rhonchi, no rales,  ABD/GI: Normal bowel sounds; non-distended; soft, non-tender, no rebound, no guarding BACK:  The back appears normal and is non-tender to palpation, there is no CVA tenderness EXT: Normal ROM in all joints; no edema; normal capillary refill; no cyanosis. No ligamentous laxity. No warmth or swelling of the knee.  2+ DP pulses bilaterally, sensation to light touch intact diffusely. Patient has some tenderness to palpation over the lateral right knee joint line. No joint effusion. No pain over her right hip or with range of motion of her right hip. SKIN: Normal color for age and race; warm NEURO: Moves all extremities equally; sensation to light touch intact diffusely PSYCH: The patient's mood and  manner are appropriate. Grooming and personal hygiene are appropriate.  MEDICAL DECISION MAKING: Patient with likely meniscal injury, arthritis. No acute injury. No signs of infection on exam. We'll give pain medication and obtain x-rays. Have recommended that she followup with orthopedist as she may benefit from steroid injection which we do not perform the emergency department.  ED PROGRESS: X-ray shows tricompartmental osteoarthritis. No effusion, fracture, dislocation. We'll discharge her with pain medication, with a followup. She reports she has crutches at home to use. Given return precautions. Patient verbalizes understanding and is comfortable plan.   Debra Maw Karmina Zufall, DO 07/18/13 [phone]

## 2013-07-31 ENCOUNTER — Telehealth (HOSPITAL_COMMUNITY): Payer: Self-pay | Admitting: *Deleted

## 2013-07-31 DIAGNOSIS — F39 Unspecified mood [affective] disorder: Secondary | ICD-10-CM

## 2013-07-31 DIAGNOSIS — F410 Panic disorder [episodic paroxysmal anxiety] without agoraphobia: Secondary | ICD-10-CM

## 2013-07-31 MED ORDER — DESVENLAFAXINE SUCCINATE ER 100 MG PO TB24: 100.0000 mg | ORAL_TABLET | Freq: Every day | ORAL | Status: DC

## 2013-07-31 MED ORDER — CARBAMAZEPINE ER 100 MG PO TB12: 100.0000 mg | ORAL_TABLET | Freq: Every day | ORAL | Status: DC

## 2013-07-31 MED ORDER — HYDROXYZINE HCL 50 MG PO TABS: ORAL_TABLET | ORAL | Status: DC

## 2013-07-31 MED ORDER — QUETIAPINE FUMARATE 300 MG PO TABS: ORAL_TABLET | ORAL | Status: DC

## 2013-07-31 NOTE — Telephone Encounter (Signed)
YN:[id number] refills of Hydroxyzine, Seroquel, Pristiq and Tegretol.Appt w/Dr. Donell Beers 09/18/13. Chart reviewed.Refills appropriate.

## 2013-08-12 ENCOUNTER — Emergency Department (HOSPITAL_BASED_OUTPATIENT_CLINIC_OR_DEPARTMENT_OTHER): Payer: Medicaid Other

## 2013-08-12 ENCOUNTER — Emergency Department (HOSPITAL_BASED_OUTPATIENT_CLINIC_OR_DEPARTMENT_OTHER)
Admission: EM | Admit: 2013-08-12 | Discharge: 2013-08-12 | Disposition: A | Discharge status: Home / Self Care | Payer: Medicaid Other | Attending: Emergency Medicine | Admitting: Emergency Medicine

## 2013-08-12 DIAGNOSIS — Z79899 Other long term (current) drug therapy: Secondary | ICD-10-CM | POA: Insufficient documentation

## 2013-08-12 DIAGNOSIS — Z8614 Personal history of Methicillin resistant Staphylococcus aureus infection: Secondary | ICD-10-CM | POA: Insufficient documentation

## 2013-08-12 DIAGNOSIS — M25461 Effusion, right knee: Secondary | ICD-10-CM

## 2013-08-12 DIAGNOSIS — F411 Generalized anxiety disorder: Secondary | ICD-10-CM | POA: Insufficient documentation

## 2013-08-12 DIAGNOSIS — Z791 Long term (current) use of non-steroidal anti-inflammatories (NSAID): Secondary | ICD-10-CM | POA: Insufficient documentation

## 2013-08-12 DIAGNOSIS — G8929 Other chronic pain: Secondary | ICD-10-CM | POA: Insufficient documentation

## 2013-08-12 DIAGNOSIS — F3130 Bipolar disorder, current episode depressed, mild or moderate severity, unspecified: Secondary | ICD-10-CM | POA: Insufficient documentation

## 2013-08-12 DIAGNOSIS — M25469 Effusion, unspecified knee: Secondary | ICD-10-CM | POA: Insufficient documentation

## 2013-08-12 DIAGNOSIS — IMO0001 Reserved for inherently not codable concepts without codable children: Secondary | ICD-10-CM | POA: Insufficient documentation

## 2013-08-12 DIAGNOSIS — Z880 Allergy status to penicillin: Secondary | ICD-10-CM | POA: Insufficient documentation

## 2013-08-12 MED ORDER — KETOROLAC TROMETHAMINE 30 MG/ML IJ SOLN
60.0000 mg | Freq: Once | INTRAMUSCULAR | Status: AC
  Administered 2013-08-12: 60 mg via INTRAMUSCULAR
  Filled 2013-08-12: qty 2

## 2013-08-12 NOTE — ED Provider Notes (Signed)
CSN: [id number]     Arrival date & time 08/12/13  1326 History   First MD Initiated Contact with Patient 08/12/13 1335     Chief Complaint  Patient presents with  . Knee Pain   (Consider location/radiation/quality/duration/timing/severity/associated sxs/prior Treatment) HPI Comments: Pt has chronic knee pain and she had her right knee injected 4 days ago:pt states that she is having a lot of swelling and pain in the area making it hard for her to walk:denies redness or warmth to the area:no fever:pt states that she has similar reaction previously  The history is provided by the patient. No language interpreter was used.    Past Medical History  Diagnosis Date  . Anxiety   . Bipolar 1 disorder   . Fibromyalgia   . Depression   . MRSA (methicillin resistant staph aureus) culture positive    Past Surgical History  Procedure Laterality Date  . Cesarean section    . Tubal ligation    . Uterine ablasion    . Abdominal hysterectomy    . Knee arthroplasty    . Ear cyst excision      x3   No family history on file. History  Substance Use Topics  . Smoking status: Never Smoker   . Smokeless tobacco: Not on file  . Alcohol Use: No   OB History   Grav Para Term Preterm Abortions TAB SAB Ect Mult Living                 Review of Systems  Constitutional: Negative.   Respiratory: Negative.   Cardiovascular: Negative.     Allergies  Penicillins; Penicillins; Tylenol; and Tylenol  Home Medications   Current Outpatient Rx  Name  Route  Sig  Dispense  Refill  . carbamazepine (TEGRETOL XR) 100 MG 12 hr tablet   Oral   Take 1 tablet (100 mg total) by mouth daily.   30 tablet   1   . desvenlafaxine (PRISTIQ) 100 MG 24 hr tablet   Oral   Take 1 tablet (100 mg total) by mouth daily.   30 tablet   1   . diphenhydrAMINE (BENADRYL) 25 MG tablet   Oral   Take 1 tablet (25 mg total) by mouth every 6 (six) hours as needed for itching (Rash).   30 tablet   0   . famotidine  (PEPCID) 20 MG tablet   Oral   Take 1 tablet (20 mg total) by mouth 2 (two) times daily.   30 tablet   0   . Furosemide (LASIX PO)   Oral   Take by mouth.         Marland Kitchen glucose blood test strip   Other   1 each by Other route as needed. Use as instructed         . hydrOXYzine (ATARAX/VISTARIL) 50 MG tablet      Take two tablets by mouth at bedtime, and one tablet daily as needed   90 tablet   1   . ibuprofen (ADVIL,MOTRIN) 200 MG tablet   Oral   Take 600 mg by mouth every 6 (six) hours as needed. For pain         . ibuprofen (ADVIL,MOTRIN) 800 MG tablet   Oral   Take 1 tablet (800 mg total) by mouth 3 (three) times daily.   21 tablet   0   . lidocaine (LIDODERM) 5 %   Transdermal   Place 1 patch onto the skin daily. Remove &  Discard patch within 12 hours or as directed by MD         . Meloxicam (MOBIC PO)   Oral   Take by mouth.         . OMEPRAZOLE PO   Oral   Take by mouth.         Marland Kitchen oxycodone (OXY-IR) 5 MG capsule   Oral   Take 5 mg by mouth 2 (two) times daily. For pain         . oxyCODONE (ROXICODONE) 15 MG immediate release tablet   Oral   Take 1 tablet (15 mg total) by mouth every 4 (four) hours as needed for pain.   24 tablet   0   . oxyCODONE (ROXICODONE) 5 MG immediate release tablet   Oral   Take 1 tablet (5 mg total) by mouth every 6 (six) hours as needed for pain.   20 tablet   0   . OxyCODONE HCl (ROXICODONE PO)   Oral   Take by mouth.         . predniSONE (DELTASONE) 20 MG tablet   Oral   Take 2 tablets (40 mg total) by mouth daily.   10 tablet   0   . Pregabalin (LYRICA PO)   Oral   Take by mouth.         . pregabalin (LYRICA) 225 MG capsule   Oral   Take 225 mg by mouth 2 (two) times daily.         . QUEtiapine (SEROQUEL) 300 MG tablet      TAKE 1 TABLET BY MOUTH EVERY NIGHT AT BEDTIME   30 tablet   1   . traMADol (ULTRAM) 50 MG tablet   Oral   Take 50 mg by mouth every 6 (six) hours as needed. For  pain         . TRAMADOL HCL PO   Oral   Take by mouth.         . traZODone (DESYREL) 50 MG tablet   Oral   Take 1-2 tablets (50-100 mg total) by mouth at bedtime.   60 tablet   1    BP 140/75  Pulse 100  Resp 16  Ht 5\' 7"  (1.702 m)  Wt 337 lb (152.862 kg)  BMI 52.77 kg/m2  SpO2 100% Physical Exam  Nursing note and vitals reviewed. Constitutional: She is oriented to person, place, and time. She appears well-developed and well-nourished.  Cardiovascular: Normal rate and regular rhythm.   Pulmonary/Chest: Effort normal.  Musculoskeletal:  Generalized swelling noted to the right knee:pulses intact:no redness or warmth noted  Neurological: She is alert and oriented to person, place, and time. Coordination normal.  Skin: Skin is warm and dry.    ED Course  Procedures (including critical care time) Labs Review Labs Reviewed - No data to display Imaging Review Dg Knee 1-2 Views Right  08/12/2013   CLINICAL DATA:  Right knee pain  EXAM: RIGHT KNEE - 1-2 VIEW  COMPARISON:  07/18/2013  FINDINGS: Normal alignment without fracture. Minor degenerative change but preserved joint spaces. Obese body habitus. Question small effusion on the lateral view.  IMPRESSION: Minor arthritic change.  Possible small effusion.  Negative for fracture   Electronically Signed   By: Ruel Favors M.D.   On: 08/12/2013 14:37    EKG Interpretation   None       MDM   1. Knee effusion, right    Pt placed in immobilizer for  comfort:doubt any infection:likely chronic problem with inflammation post injection    Teressa Lower, NP 08/12/13 1512

## 2013-08-12 NOTE — ED Notes (Signed)
Pt. Reports Dr. Genella Mech placed symvist in the R knee on Thurs.  Pt. Reports she is unable to walk today.  Pt. Reports the Dr. Is her pain management Dr.

## 2013-08-13 ENCOUNTER — Encounter (HOSPITAL_BASED_OUTPATIENT_CLINIC_OR_DEPARTMENT_OTHER): Payer: Self-pay | Admitting: Emergency Medicine

## 2013-08-13 NOTE — ED Provider Notes (Signed)
Medical screening examination/treatment/procedure(s) were performed by non-physician practitioner and as supervising physician I was immediately available for consultation/collaboration.  EKG Interpretation   None        Megan E Docherty, MD 08/13/13 1752 

## 2013-09-18 ENCOUNTER — Other Ambulatory Visit (HOSPITAL_COMMUNITY): Payer: Self-pay | Admitting: Physician Assistant

## 2013-09-18 ENCOUNTER — Ambulatory Visit (INDEPENDENT_AMBULATORY_CARE_PROVIDER_SITE_OTHER): Payer: Federal, State, Local not specified - Other | Admitting: Psychiatry

## 2013-09-18 VITALS — BP 144/100 | HR 84 | Ht 67.0 in | Wt 326.4 lb

## 2013-09-18 DIAGNOSIS — F39 Unspecified mood [affective] disorder: Secondary | ICD-10-CM

## 2013-09-18 DIAGNOSIS — F4323 Adjustment disorder with mixed anxiety and depressed mood: Secondary | ICD-10-CM

## 2013-09-18 DIAGNOSIS — F4322 Adjustment disorder with anxiety: Secondary | ICD-10-CM

## 2013-09-18 MED ORDER — TRAZODONE HCL 100 MG PO TABS: 50.0000 mg | ORAL_TABLET | Freq: Every day | ORAL | Status: DC

## 2013-09-18 MED ORDER — HYDROXYZINE HCL 50 MG PO TABS: ORAL_TABLET | ORAL | Status: DC

## 2013-09-18 MED ORDER — BUSPIRONE HCL 10 MG PO TABS: 10.0000 mg | ORAL_TABLET | Freq: Two times a day (BID) | ORAL | Status: DC
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
## 2013-09-23 ENCOUNTER — Telehealth (HOSPITAL_COMMUNITY): Payer: Self-pay | Admitting: *Deleted

## 2013-09-23 NOTE — Telephone Encounter (Signed)
Called stating her Trazodone was never called in to her pharmacy last week. Attempted return call to let patient know that it was filled 09/18/13 by Dr.Plovsky. No answer on callback.

## 2013-09-23 NOTE — Telephone Encounter (Signed)
Chart reviewed, refill not appropriate. Has 2 refills on file with pharmacy. Called to confirm. Attempted to notify patient since they had called earlier, no answer.

## 2013-09-25 ENCOUNTER — Telehealth (HOSPITAL_COMMUNITY): Payer: Self-pay | Admitting: *Deleted

## 2013-09-25 DIAGNOSIS — F39 Unspecified mood [affective] disorder: Secondary | ICD-10-CM

## 2013-09-25 MED ORDER — TRAZODONE HCL 100 MG PO TABS: 50.0000 mg | ORAL_TABLET | Freq: Every day | ORAL | Status: DC

## 2013-09-25 NOTE — Telephone Encounter (Signed)
RX for Trazodone clarified with Dr.Plovsky New RX called to pharmacy Notified pt

## 2013-10-01 ENCOUNTER — Telehealth (HOSPITAL_COMMUNITY): Payer: Self-pay | Admitting: *Deleted

## 2013-10-01 NOTE — Telephone Encounter (Signed)
See phone note

## 2013-10-02 ENCOUNTER — Telehealth (HOSPITAL_COMMUNITY): Payer: Self-pay | Admitting: *Deleted

## 2013-10-02 NOTE — Telephone Encounter (Signed)
Patient states has had headache for over a week.This past Monday, also became nauseous with headache.Wondering if headache and nausea due to medication changes made recently Informed her MD would be given information.

## 2013-10-03 NOTE — Telephone Encounter (Signed)
Contacted pt at MD request to determine medication status: Asked if she stopped Pristiq.Pt states she did as that is what she though MD wanted her to do. Confirmed this with pt. Advised pt per MD that stopping Pristiq may have caused her to have headache for a short while.If stopping medicine is what caused headache, the headache should be stopping soon. If they do not stop soon, it may be from another cause and she should see her medical MD.

## 2013-10-04 ENCOUNTER — Ambulatory Visit (HOSPITAL_COMMUNITY): Payer: Self-pay | Admitting: Psychiatry

## 2013-10-10 ENCOUNTER — Ambulatory Visit (HOSPITAL_COMMUNITY): Payer: Self-pay | Admitting: Psychiatry

## 2013-11-20 ENCOUNTER — Telehealth (HOSPITAL_COMMUNITY): Payer: Self-pay

## 2013-11-20 ENCOUNTER — Ambulatory Visit (INDEPENDENT_AMBULATORY_CARE_PROVIDER_SITE_OTHER): Payer: Federal, State, Local not specified - Other | Admitting: Psychiatry

## 2013-11-20 DIAGNOSIS — F4323 Adjustment disorder with mixed anxiety and depressed mood: Secondary | ICD-10-CM

## 2013-11-20 MED ORDER — BUSPIRONE HCL 10 MG PO TABS: 10.0000 mg | ORAL_TABLET | Freq: Two times a day (BID) | ORAL | Status: DC

## 2013-11-20 MED ORDER — TEMAZEPAM 15 MG PO CAPS: ORAL_CAPSULE | ORAL | Status: DC
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
## 2013-11-26 ENCOUNTER — Ambulatory Visit (INDEPENDENT_AMBULATORY_CARE_PROVIDER_SITE_OTHER): Payer: Federal, State, Local not specified - Other | Admitting: Psychiatry

## 2013-11-26 DIAGNOSIS — F4323 Adjustment disorder with mixed anxiety and depressed mood: Secondary | ICD-10-CM
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
## 2014-01-13 ENCOUNTER — Ambulatory Visit (HOSPITAL_COMMUNITY): Payer: Self-pay | Admitting: Psychiatry

## 2014-01-20 ENCOUNTER — Ambulatory Visit (HOSPITAL_COMMUNITY): Payer: Self-pay | Admitting: Psychiatry

## 2014-01-27 ENCOUNTER — Ambulatory Visit (HOSPITAL_COMMUNITY): Payer: Self-pay | Admitting: Psychiatry

## 2014-02-10 ENCOUNTER — Encounter (HOSPITAL_BASED_OUTPATIENT_CLINIC_OR_DEPARTMENT_OTHER): Payer: Self-pay | Admitting: Emergency Medicine

## 2014-02-10 ENCOUNTER — Emergency Department (HOSPITAL_BASED_OUTPATIENT_CLINIC_OR_DEPARTMENT_OTHER)
Admission: EM | Admit: 2014-02-10 | Discharge: 2014-02-10 | Disposition: A | Discharge status: Home / Self Care | Payer: Medicaid Other | Attending: Emergency Medicine | Admitting: Emergency Medicine

## 2014-02-10 ENCOUNTER — Emergency Department (HOSPITAL_BASED_OUTPATIENT_CLINIC_OR_DEPARTMENT_OTHER): Payer: Medicaid Other

## 2014-02-10 DIAGNOSIS — Z880 Allergy status to penicillin: Secondary | ICD-10-CM | POA: Insufficient documentation

## 2014-02-10 DIAGNOSIS — F411 Generalized anxiety disorder: Secondary | ICD-10-CM | POA: Insufficient documentation

## 2014-02-10 DIAGNOSIS — R05 Cough: Secondary | ICD-10-CM

## 2014-02-10 DIAGNOSIS — Z791 Long term (current) use of non-steroidal anti-inflammatories (NSAID): Secondary | ICD-10-CM | POA: Insufficient documentation

## 2014-02-10 DIAGNOSIS — R059 Cough, unspecified: Secondary | ICD-10-CM

## 2014-02-10 DIAGNOSIS — R1110 Vomiting, unspecified: Secondary | ICD-10-CM | POA: Insufficient documentation

## 2014-02-10 DIAGNOSIS — Z79899 Other long term (current) drug therapy: Secondary | ICD-10-CM | POA: Insufficient documentation

## 2014-02-10 DIAGNOSIS — IMO0002 Reserved for concepts with insufficient information to code with codable children: Secondary | ICD-10-CM | POA: Insufficient documentation

## 2014-02-10 DIAGNOSIS — E669 Obesity, unspecified: Secondary | ICD-10-CM | POA: Insufficient documentation

## 2014-02-10 DIAGNOSIS — Z8739 Personal history of other diseases of the musculoskeletal system and connective tissue: Secondary | ICD-10-CM | POA: Insufficient documentation

## 2014-02-10 DIAGNOSIS — Z8614 Personal history of Methicillin resistant Staphylococcus aureus infection: Secondary | ICD-10-CM | POA: Insufficient documentation

## 2014-02-10 DIAGNOSIS — F319 Bipolar disorder, unspecified: Secondary | ICD-10-CM | POA: Insufficient documentation

## 2014-02-10 MED ORDER — HYDROCOD POLST-CHLORPHEN POLST 10-8 MG/5ML PO LQCR: 5.0000 mL | Freq: Two times a day (BID) | ORAL | Status: DC

## 2014-02-10 NOTE — ED Notes (Signed)
Productive cough since January.  Pt having some episodes of emesis after coughing spell.  Pt noted brown sputum.  Pt states she has noticed some wheezing with coughing.

## 2014-02-10 NOTE — Discharge Instructions (Signed)
Tussionex Rx (cough supressant). Re check with your PCP Re:  Pulmonary referral. Continue zyrtec and Prilosec.  Cough, Adult  A cough is a reflex. It helps you clear your throat and airways. A cough can help heal your body. A cough can last 2 or 3 weeks (acute) or may last more than 8 weeks (chronic). Some common causes of a cough can include an infection, allergy, or a cold. HOME CARE  Only take medicine as told by your doctor.  If given, take your medicines (antibiotics) as told. Finish them even if you start to feel better.  Use a cold steam vaporizer or humidier in your home. This can help loosen thick spit (secretions).  Sleep so you are almost sitting up (semi-upright). Use pillows to do this. This helps reduce coughing.  Rest as needed.  Stop smoking if you smoke. GET HELP RIGHT AWAY IF:  You have yellowish-white fluid (pus) in your thick spit.  Your cough gets worse.  Your medicine does not reduce coughing, and you are losing sleep.  You cough up blood.  You have trouble breathing.  Your pain gets worse and medicine does not help.  You have a fever. MAKE SURE YOU:   Understand these instructions.  Will watch your condition.  Will get help right away if you are not doing well or get worse. Document Released: 05/19/2011 Document Revised: 11/28/2011 Document Reviewed: 05/19/2011 [facility] Patient Information 2014 [place], [place].

## 2014-02-10 NOTE — ED Notes (Signed)
Patient transported to & from xray. 

## 2014-02-10 NOTE — ED Provider Notes (Signed)
CSN: [id number]     Arrival date & time 02/10/14  7121 History   First MD Initiated Contact with Patient 02/10/14 [phone]     Chief Complaint  Patient presents with  . Cough  . Emesis      HPI  Patient presents with a cough since January. Is seen by multiple providers for initially seen in urgent care in January with cough. She was disappointed that they did not have chest x-ray available. He was given Zyrtec for probable allergic rhinitis. After a month's time she was not better. Seen by her internist. Placed on antibiotic empirically for "a lung infection". She took this for 4 days and developed a rash. Stop taking it. Her symptoms persisted. She states that she gets recurrent cysts in her ears. Was seen by her ENT physician at her "ear cyst drained". Her cough resolved for a week. Is now and presents again for a month.  Was seen by her primary care physician referred to GI. Had normal upper endoscopy. Essentially has been on Zyrtec and omeprazole without improvement of her symptoms. She presents here.  Causes daily. Symptoms at night. Frequent post tussive emesis. Productive of some yellow sputum. No fevers no chills does not feel short of breath. Feels "sore in my ribs". No hemoptysis. No PE risks.  Past Medical History  Diagnosis Date  . Anxiety   . Bipolar 1 disorder   . Fibromyalgia   . Depression   . MRSA (methicillin resistant staph aureus) culture positive    Past Surgical History  Procedure Laterality Date  . Cesarean section    . Tubal ligation    . Uterine ablasion    . Abdominal hysterectomy    . Knee arthroplasty    . Ear cyst excision      x3   No family history on file. History  Substance Use Topics  . Smoking status: Never Smoker   . Smokeless tobacco: Not on file  . Alcohol Use: No   OB History   Grav Para Term Preterm Abortions TAB SAB Ect Mult Living                 Review of Systems  Constitutional: Negative for fever, chills, diaphoresis, appetite  change and fatigue.  HENT: Negative for mouth sores, sore throat and trouble swallowing.   Eyes: Negative for visual disturbance.  Respiratory: Positive for cough. Negative for chest tightness, shortness of breath and wheezing.   Cardiovascular: Negative for chest pain.  Gastrointestinal: Positive for vomiting. Negative for nausea, abdominal pain, diarrhea and abdominal distention.  Endocrine: Negative for polydipsia, polyphagia and polyuria.  Genitourinary: Negative for dysuria, frequency and hematuria.  Musculoskeletal: Negative for gait problem.  Skin: Negative for color change, pallor and rash.  Neurological: Negative for dizziness, syncope, light-headedness and headaches.  Hematological: Does not bruise/bleed easily.  Psychiatric/Behavioral: Negative for behavioral problems and confusion.      Allergies  Penicillins; Penicillins; Tylenol; and Tylenol  Home Medications   Prior to Admission medications   Medication Sig Start Date End Date Taking? Authorizing Provider  busPIRone (BUSPAR) 10 MG tablet Take 1 tablet (10 mg total) by mouth 2 (two) times daily. 11/20/13   Archer Asa, MD  desvenlafaxine (PRISTIQ) 100 MG 24 hr tablet Take 1 tablet (100 mg total) by mouth daily. 07/31/13 07/31/14  Archer Asa, MD  diphenhydrAMINE (BENADRYL) 25 MG tablet Take 1 tablet (25 mg total) by mouth every 6 (six) hours as needed for itching (Rash). 10/20/12   Arlys John  Alanda Amass, MD  famotidine (PEPCID) 20 MG tablet Take 1 tablet (20 mg total) by mouth 2 (two) times daily. 10/20/12   Vida Roller, MD  Furosemide (LASIX PO) Take by mouth.    Historical Provider, MD  glucose blood test strip 1 each by Other route as needed. Use as instructed    Historical Provider, MD  ibuprofen (ADVIL,MOTRIN) 200 MG tablet Take 600 mg by mouth every 6 (six) hours as needed. For pain    Historical Provider, MD  ibuprofen (ADVIL,MOTRIN) 800 MG tablet Take 1 tablet (800 mg total) by mouth 3 (three) times daily. 07/18/13    Kristen N Ward, DO  lidocaine (LIDODERM) 5 % Place 1 patch onto the skin daily. Remove & Discard patch within 12 hours or as directed by MD    Historical Provider, MD  Meloxicam (MOBIC PO) Take by mouth.    Historical Provider, MD  OMEPRAZOLE PO Take by mouth.    Historical Provider, MD  oxycodone (OXY-IR) 5 MG capsule Take 5 mg by mouth 2 (two) times daily. For pain    Historical Provider, MD  oxyCODONE (ROXICODONE) 15 MG immediate release tablet Take 1 tablet (15 mg total) by mouth every 4 (four) hours as needed for pain. 09/21/12   Arthor Captain, PA-C  oxyCODONE (ROXICODONE) 5 MG immediate release tablet Take 1 tablet (5 mg total) by mouth every 6 (six) hours as needed for pain. 07/18/13   Kristen N Ward, DO  OxyCODONE HCl (ROXICODONE PO) Take by mouth.    Historical Provider, MD  predniSONE (DELTASONE) 20 MG tablet Take 2 tablets (40 mg total) by mouth daily. 10/20/12   Vida Roller, MD  Pregabalin (LYRICA PO) Take by mouth.    Historical Provider, MD  pregabalin (LYRICA) 225 MG capsule Take 225 mg by mouth 2 (two) times daily.    Historical Provider, MD  temazepam (RESTORIL) 15 MG capsule 1 qhs  May repeat 11/20/13   Archer Asa, MD  traMADol (ULTRAM) 50 MG tablet Take 50 mg by mouth every 6 (six) hours as needed. For pain    Historical Provider, MD  TRAMADOL HCL PO Take by mouth.    Historical Provider, MD   BP 150/75  Pulse 88  Temp(Src) 98.5 F (36.9 C) (Oral)  Resp 20  Ht 5\' 7"  (1.702 m)  Wt 308 lb 11.2 oz (140.025 kg)  BMI 48.34 kg/m2  SpO2 100% Physical Exam  Constitutional: She is oriented to person, place, and time. She appears well-developed and well-nourished. No distress.  Obese.  HENT:  Head: Normocephalic.  Eyes: Conjunctivae are normal. Pupils are equal, round, and reactive to light. No scleral icterus.  Neck: Normal range of motion. Neck supple. No thyromegaly present.  Cardiovascular: Normal rate and regular rhythm.  Exam reveals no gallop and no friction rub.    No murmur heard. Pulmonary/Chest: Effort normal and breath sounds normal. No respiratory distress. She has no wheezes. She has no rales.  Clear lungs. No asymmetric breath sounds. No wheezing  Abdominal: Soft. Bowel sounds are normal. She exhibits no distension. There is no tenderness. There is no rebound.  Musculoskeletal: Normal range of motion.  Neurological: She is alert and oriented to person, place, and time.  Skin: Skin is warm and dry. No rash noted.  Psychiatric: She has a normal mood and affect. Her behavior is normal.    ED Course  Procedures (including critical care time) Labs Review Labs Reviewed - No data to display  Imaging Review  Dg Chest 2 View  02/10/2014   CLINICAL DATA:  Cough, wheezing and chest pain  EXAM: CHEST  2 VIEW  COMPARISON:  None.  FINDINGS: Subtle right lower lobe patchy opacity in a peribronchovascular distribution may reflect early bronchopneumonia or subsegmental atelectasis in the setting of acute bronchitis. The cardiac and mediastinal contours are within normal limits. No additional focal airspace consolidation. No pleural effusion or pneumothorax. Negative for edema. Osseous structures are intact and unremarkable for age.  IMPRESSION: Mild patchy opacity in the right lower lobe in a peribronchovascular distribution may reflect acute bronchitis and associated subsegmental atelectasis, or early bronchopneumonia in the appropriate clinical setting.   Electronically Signed   By: Malachy MoanHeath  McCullough M.D.   On: 02/10/2014 10:17     EKG Interpretation None      MDM   Final diagnoses:  Cough    Normal pulmonary exam. Normal chest x-ray. Of offer her supple cough suppressant.  Her primary care physician. Continue her Zyrtec for allergic rhinitis. Continue her Prilosec for reflux.    Rolland PorterMark Corby Vandenberghe, MD 02/10/14 1059

## 2014-02-11 ENCOUNTER — Ambulatory Visit (INDEPENDENT_AMBULATORY_CARE_PROVIDER_SITE_OTHER): Payer: Federal, State, Local not specified - Other | Admitting: Psychiatry

## 2014-02-11 DIAGNOSIS — F4323 Adjustment disorder with mixed anxiety and depressed mood: Secondary | ICD-10-CM
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
## 2014-02-24 ENCOUNTER — Ambulatory Visit (HOSPITAL_COMMUNITY): Payer: Self-pay | Admitting: Psychiatry

## 2014-03-10 ENCOUNTER — Ambulatory Visit (INDEPENDENT_AMBULATORY_CARE_PROVIDER_SITE_OTHER): Payer: Federal, State, Local not specified - Other | Admitting: Psychiatry

## 2014-03-10 DIAGNOSIS — F39 Unspecified mood [affective] disorder: Secondary | ICD-10-CM

## 2014-03-10 DIAGNOSIS — F4323 Adjustment disorder with mixed anxiety and depressed mood: Secondary | ICD-10-CM
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
## 2014-03-12 ENCOUNTER — Ambulatory Visit (INDEPENDENT_AMBULATORY_CARE_PROVIDER_SITE_OTHER): Payer: Federal, State, Local not specified - Other | Admitting: Psychiatry

## 2014-03-12 DIAGNOSIS — F4323 Adjustment disorder with mixed anxiety and depressed mood: Secondary | ICD-10-CM

## 2014-03-12 DIAGNOSIS — F4321 Adjustment disorder with depressed mood: Secondary | ICD-10-CM

## 2014-03-12 MED ORDER — BUSPIRONE HCL 10 MG PO TABS: 10.0000 mg | ORAL_TABLET | Freq: Two times a day (BID) | ORAL | Status: DC

## 2014-03-12 MED ORDER — TEMAZEPAM 15 MG PO CAPS: ORAL_CAPSULE | ORAL | Status: DC
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
## 2014-04-02 ENCOUNTER — Ambulatory Visit (INDEPENDENT_AMBULATORY_CARE_PROVIDER_SITE_OTHER): Payer: Federal, State, Local not specified - Other | Admitting: Psychiatry

## 2014-04-02 DIAGNOSIS — F4323 Adjustment disorder with mixed anxiety and depressed mood: Secondary | ICD-10-CM

## 2014-04-02 DIAGNOSIS — F39 Unspecified mood [affective] disorder: Secondary | ICD-10-CM
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
## 2014-04-16 ENCOUNTER — Ambulatory Visit (INDEPENDENT_AMBULATORY_CARE_PROVIDER_SITE_OTHER): Payer: Federal, State, Local not specified - Other | Admitting: Psychiatry

## 2014-04-16 DIAGNOSIS — F4323 Adjustment disorder with mixed anxiety and depressed mood: Secondary | ICD-10-CM

## 2014-04-16 DIAGNOSIS — F39 Unspecified mood [affective] disorder: Secondary | ICD-10-CM
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
## 2014-05-07 ENCOUNTER — Ambulatory Visit (HOSPITAL_COMMUNITY): Payer: Self-pay | Admitting: Psychiatry

## 2014-05-08 ENCOUNTER — Ambulatory Visit (INDEPENDENT_AMBULATORY_CARE_PROVIDER_SITE_OTHER): Payer: Federal, State, Local not specified - Other | Admitting: Psychiatry

## 2014-05-08 DIAGNOSIS — F4323 Adjustment disorder with mixed anxiety and depressed mood: Secondary | ICD-10-CM
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
## 2014-05-22 ENCOUNTER — Ambulatory Visit (HOSPITAL_COMMUNITY): Payer: Self-pay | Admitting: Psychiatry

## 2014-06-05 ENCOUNTER — Ambulatory Visit (INDEPENDENT_AMBULATORY_CARE_PROVIDER_SITE_OTHER): Payer: Federal, State, Local not specified - Other | Admitting: Psychiatry

## 2014-06-05 DIAGNOSIS — F4323 Adjustment disorder with mixed anxiety and depressed mood: Secondary | ICD-10-CM
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
## 2014-06-19 ENCOUNTER — Ambulatory Visit (INDEPENDENT_AMBULATORY_CARE_PROVIDER_SITE_OTHER): Payer: Federal, State, Local not specified - Other | Admitting: Psychiatry

## 2014-06-19 DIAGNOSIS — F4323 Adjustment disorder with mixed anxiety and depressed mood: Secondary | ICD-10-CM
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
## 2014-07-14 ENCOUNTER — Ambulatory Visit (INDEPENDENT_AMBULATORY_CARE_PROVIDER_SITE_OTHER): Payer: Federal, State, Local not specified - Other | Admitting: Psychiatry

## 2014-07-14 VITALS — BP 136/88 | HR 98 | Ht 67.0 in | Wt 284.4 lb

## 2014-07-14 DIAGNOSIS — F410 Panic disorder [episodic paroxysmal anxiety] without agoraphobia: Secondary | ICD-10-CM

## 2014-07-14 DIAGNOSIS — F4323 Adjustment disorder with mixed anxiety and depressed mood: Secondary | ICD-10-CM

## 2014-07-15 NOTE — Progress Notes (Signed)
   THERAPIST PROGRESS NOTE  Session Time: 2:00-3:00   Participation Level: Active   Behavioral Response: CasualAlertEuthymic   Type of Therapy: Individual Therapy   Treatment Goals addressed: emotion regulation, coping, stress management   Interventions: CBT   Summary: Debra Cardenas is a 43 y.o. female who presents with anxiety, depression.   Suicidal/Homicidal: Nowithout intent/plan   Therapist Response: Pt. Continues to present with bright mood, talks and laughs appropriately. Pt. Reports that she has returned to relationship with partner, but feels comfortable and safe with new boundaries that she has set for the relationship. Pt.'s mother continues to be a significant stressor. Session focused on identifying relationship with mother as a significant stressor and necessary for setting boundaries in the relationship. Pt. Also in the process of recognizing personal and professional strengths related to career transition.   Plan: Pt. To continue with CBT based treatment. Return again in 2 weeks.   Diagnosis: Axis I: Depressive Disorder NOS   Axis II: No diagnosis    Shaune PollackBrown, Debra Lull B, [facility] 07/15/2014

## 2014-07-31 ENCOUNTER — Ambulatory Visit (INDEPENDENT_AMBULATORY_CARE_PROVIDER_SITE_OTHER): Payer: Federal, State, Local not specified - Other | Admitting: Psychiatry

## 2014-07-31 DIAGNOSIS — F410 Panic disorder [episodic paroxysmal anxiety] without agoraphobia: Secondary | ICD-10-CM

## 2014-07-31 DIAGNOSIS — F4323 Adjustment disorder with mixed anxiety and depressed mood: Secondary | ICD-10-CM

## 2014-08-01 NOTE — Progress Notes (Signed)
   THERAPIST PROGRESS NOTE  Session Time: 2:00-3:00   Participation Level: Active   Behavioral Response: CasualAlertEuthymic  Type of Therapy: Individual Therapy   Treatment Goals addressed: emotion regulation, coping, stress management   Interventions: CBT   Summary: Debra Cardenas is a 43 y.o. female who presents with anxiety, depression.   Suicidal/Homicidal: Nowithout intent/plan   Therapist Response: Pt. Continues to present with bright mood, talks and laughs appropriately. Pt. Reports stress from tension between her projected, composed image and need to for people to see her imperfect. Pt. Discussed fear of allowing her children to see her vulnerable. Pt. Discussed desire to move to [place] to pursue better job opportunities for herself and her daughter. Significant part of session spent discuss the role of perfectionism in her life and how need to be perfect has kept her pursuing church involvement and having honest relationships with her family. Made referral to vocational rehabilitation for career assessment.  Plan: Pt. To continue with CBT based treatment. Return again in 2 weeks.   Diagnosis: Axis I: Depressive Disorder NOS   Axis II: No diagnosis  Shaune PollackBrown, Jennifer B, [facility] 08/01/2014

## 2014-08-13 ENCOUNTER — Ambulatory Visit (HOSPITAL_COMMUNITY): Payer: Self-pay | Admitting: Psychiatry

## 2014-09-08 ENCOUNTER — Ambulatory Visit (HOSPITAL_COMMUNITY): Payer: Self-pay | Admitting: Psychiatry

## 2014-09-18 ENCOUNTER — Other Ambulatory Visit (HOSPITAL_COMMUNITY): Payer: Self-pay | Admitting: Psychiatry

## 2014-09-18 ENCOUNTER — Telehealth (HOSPITAL_COMMUNITY): Payer: Self-pay | Admitting: *Deleted

## 2014-09-18 DIAGNOSIS — F4323 Adjustment disorder with mixed anxiety and depressed mood: Secondary | ICD-10-CM

## 2014-09-18 MED ORDER — BUSPIRONE HCL 10 MG PO TABS: 10.0000 mg | ORAL_TABLET | Freq: Two times a day (BID) | ORAL | Status: DC

## 2014-09-18 NOTE — Telephone Encounter (Signed)
Refilled Buspar 14 tabs. Appt on Jan 6,2016

## 2014-09-18 NOTE — Telephone Encounter (Signed)
Pt is Dr. Donell BeersPlovsky pt. Pt requesting refills. Pt called stating her Buspar and Restoril script has expired and would like to know if she could get new script sent to pharmacy. Pt next appt is scheduled for 3:45pm September 24, 2014. Per Pt pharmacy, pt Restoril has expire and no longer have updated script but they have a script on file from 11-20-2013 with 5 refills and can get that ready for pt. Informed pt of this and pt shows understand. Pt number is [phone].

## 2014-09-18 NOTE — Addendum Note (Signed)
Addended by: Oletta DarterAGARWAL, Eldoris Beiser on: 09/18/2014 12:09 PM   Modules accepted: Orders

## 2014-09-22 ENCOUNTER — Ambulatory Visit (INDEPENDENT_AMBULATORY_CARE_PROVIDER_SITE_OTHER): Payer: 59 | Admitting: Psychiatry

## 2014-09-22 DIAGNOSIS — F4323 Adjustment disorder with mixed anxiety and depressed mood: Secondary | ICD-10-CM

## 2014-09-22 DIAGNOSIS — F410 Panic disorder [episodic paroxysmal anxiety] without agoraphobia: Secondary | ICD-10-CM

## 2014-09-24 ENCOUNTER — Ambulatory Visit (INDEPENDENT_AMBULATORY_CARE_PROVIDER_SITE_OTHER): Payer: 59 | Admitting: Psychiatry

## 2014-09-24 VITALS — BP 135/75 | HR 67 | Ht 67.0 in | Wt 286.2 lb

## 2014-09-24 DIAGNOSIS — F4323 Adjustment disorder with mixed anxiety and depressed mood: Secondary | ICD-10-CM

## 2014-09-24 DIAGNOSIS — F4322 Adjustment disorder with anxiety: Secondary | ICD-10-CM

## 2014-09-24 MED ORDER — BUSPIRONE HCL 10 MG PO TABS: 10.0000 mg | ORAL_TABLET | Freq: Two times a day (BID) | ORAL | Status: DC

## 2014-09-24 MED ORDER — TEMAZEPAM 15 MG PO CAPS: ORAL_CAPSULE | ORAL | Status: DC
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
## 2014-09-24 NOTE — Progress Notes (Signed)
[facility] MD Progress Note  09/24/2014 4:04 PM Debra Cardenas [facility]  MRN:  [id number] Subjective: Doing well Today the patient says that she is actually feeling better. She's actually lost 30 pounds. She never could get barometric surgery. She denies depression and says her anxiety is much better on BuSpar. The first time in a long time she sleeping consistently. Restoril seems to help. She can concentrate well and is looking forward to starting back in school the week. The patient seems to be stable talks clearly and has confidence. She enjoys watching TV enjoys reading self-help books. She continues in talking therapy. The patient denies use of alcohol or drugs. There is no evidence of psychosis. She does appears were she feels down a bit but it is never persistent. Diagnosis:   DSM5: Schizophrenia Disorders:   Obsessive-Compulsive Disorders:   Trauma-Stressor Disorders:   Substance/Addictive Disorders:   Depressive Disorders:   Total Time spent with patient:   Axis I: Adjustment Disorder with Anxiety  ADL's:  Intact  Sleep: Good  Appetite:  Good  Suicidal Ideation:  no Homicidal Ideation:  none AEB (as evidenced by):  Psychiatric Specialty Exam: Physical Exam  ROS  There were no vitals taken for this visit.There is no weight on file to calculate BMI.  General Appearance: Meticulous  Eye Contact::  Good  Speech:  Blocked and Clear and Coherent  Volume:  Normal  Mood:  Euphoric and Euthymic  Affect:  Congruent  Thought Process:  Coherent  Orientation:  Full (Time, Place, and Person)  Thought Content:  WDL  Suicidal Thoughts:  No  Homicidal Thoughts:  No  Memory:  NA  Judgement:  NA  Insight:  NA  Psychomotor Activity:  Normal  Concentration:  Good  Recall:  Good  Fund of Knowledge:Good  Language: Good  Akathisia:  No  Handed:  Right  AIMS (if indicated):     Assets:  Desire for Improvement  Sleep:      Musculoskeletal: Strength & Muscle Tone:  Gait & Station:   Patient leans:   Current Medications: Current Outpatient Prescriptions  Medication Sig Dispense Refill  . atenolol (TENORMIN) 25 MG tablet Take 25 mg by mouth daily.    . busPIRone (BUSPAR) 10 MG tablet Take 1 tablet (10 mg total) by mouth 2 (two) times daily. 60 tablet 6  . chlorpheniramine-HYDROcodone (TUSSIONEX PENNKINETIC ER) 10-8 MG/5ML LQCR Take 5 mLs by mouth every 12 (twelve) hours. 60 mL 0  . fluticasone (FLONASE) 50 MCG/ACT nasal spray Place 2 sprays into both nostrils daily.    . Phentermine-Topiramate (QSYMIA) 7.5-46 MG CP24 Take 1 tablet by mouth every morning.    . pregabalin (LYRICA) 225 MG capsule Take 225 mg by mouth 2 (two) times daily.    . temazepam (RESTORIL) 15 MG capsule 1 qhs  May repeat 60 capsule 5   No current facility-administered medications for this visit.    Lab Results: No results found for this or any previous visit (from the past 48 hour(s)).  Physical Findings: AIMS:  , ,  ,  ,    CIWA:    COWS:     Treatment Plan Summary: At this time the patient continue taking BuSpar twice a day 10 mg and Restoril 15 mg. She'll continue in talking therapy with Victorino Dike brown. She'll return to see me in 3 months.  Plan:  Medical Decision Making Problem Points:  Established problem, worsening (2) Data Points:  Review of medication regiment & side effects (2)  I certify  that inpatient services furnished can reasonably be expected to improve the patient's condition.   Debra Cardenas 09/24/2014, 4:04 PM

## 2014-10-06 ENCOUNTER — Ambulatory Visit (INDEPENDENT_AMBULATORY_CARE_PROVIDER_SITE_OTHER): Payer: 59 | Admitting: Psychiatry

## 2014-10-06 DIAGNOSIS — F4323 Adjustment disorder with mixed anxiety and depressed mood: Secondary | ICD-10-CM

## 2014-10-06 DIAGNOSIS — F411 Generalized anxiety disorder: Secondary | ICD-10-CM

## 2014-10-07 NOTE — Telephone Encounter (Signed)
[facility] [address] [place] patient's Restoril order from 09/24/14 office visit.  Patient to get 15mg  1 at bedtime, with repeat order as needed # 60.

## 2014-10-08 NOTE — Progress Notes (Signed)
   THERAPIST PROGRESS NOTE Session Time: 2:05-3:00   Participation Level: Active   Behavioral Response: CasualAlertEuthymic  Type of Therapy: Individual Therapy   Treatment Goals addressed: emotion regulation, coping, stress management   Interventions: CBT   Summary: Debra Cardenas is a 44 y.o. female who presents with anxiety, depression.   Suicidal/Homicidal: Nowithout intent/plan   Therapist Response: Pt. Continues to present with bright mood, talks and laughs appropriately. Pt. Reports that she has started classes at [employer] and is excited that she has has two semesters left in school. Pt. Reported that she is taking yoga and swimming which she believes will be good for ongoing stress management. Pt. Reported that she is interviewing for a job that may conflict with her class schedule, but reports that that she needs the money so she may have to drop two of her classes in order to accomodate her work schedule. Pt. Reports that she learned of death of paternal aunt is is making plans to attend her aunt's funeral. Pt. Reports that she has made considerable progress in setting healthier boundaries in romantic relationship, with her mother and her father. Pt.'s overall attitude was positive and communicated hopefulness related to physical health, continuing school, employment, and relationships. Pt. Reported that she has made progress with collecting materials for creating a vision board to help keep her focused on her school, work, and relationship goals.   Plan: Pt. To continue with CBT based therapy. Return again in 2 weeks.   Diagnosis: Axis I: Depressive Disorder NOS   Axis II: No diagnosis    Debra Cardenas, Debra Cardenas, [facility] 10/08/2014

## 2014-10-16 ENCOUNTER — Ambulatory Visit (HOSPITAL_COMMUNITY): Payer: 59 | Admitting: Psychiatry

## 2014-11-06 ENCOUNTER — Emergency Department (HOSPITAL_BASED_OUTPATIENT_CLINIC_OR_DEPARTMENT_OTHER): Payer: Self-pay

## 2014-11-06 ENCOUNTER — Emergency Department (HOSPITAL_BASED_OUTPATIENT_CLINIC_OR_DEPARTMENT_OTHER)
Admission: EM | Admit: 2014-11-06 | Discharge: 2014-11-06 | Disposition: A | Discharge status: Home / Self Care | Payer: Self-pay | Attending: Emergency Medicine | Admitting: Emergency Medicine

## 2014-11-06 ENCOUNTER — Encounter (HOSPITAL_BASED_OUTPATIENT_CLINIC_OR_DEPARTMENT_OTHER): Payer: Self-pay | Admitting: Emergency Medicine

## 2014-11-06 DIAGNOSIS — L02512 Cutaneous abscess of left hand: Secondary | ICD-10-CM

## 2014-11-06 DIAGNOSIS — L03012 Cellulitis of left finger: Secondary | ICD-10-CM | POA: Insufficient documentation

## 2014-11-06 LAB — CBC WITH DIFFERENTIAL/PLATELET
Basophils Absolute: 0 10*3/uL (ref 0.0–0.1)
Basophils Relative: 0 % (ref 0–1)
EOS ABS: 0.1 10*3/uL (ref 0.0–0.7)
Eosinophils Relative: 1 % (ref 0–5)
HCT: 40 % (ref 36.0–46.0)
HEMOGLOBIN: 13 g/dL (ref 12.0–15.0)
LYMPHS ABS: 3.6 10*3/uL (ref 0.7–4.0)
Lymphocytes Relative: 33 % (ref 12–46)
MCH: 27.5 pg (ref 26.0–34.0)
MCHC: 32.5 g/dL (ref 30.0–36.0)
MCV: 84.6 fL (ref 78.0–100.0)
Monocytes Absolute: 1.4 10*3/uL — ABNORMAL HIGH (ref 0.1–1.0)
Monocytes Relative: 12 % (ref 3–12)
NEUTROS PCT: 54 % (ref 43–77)
Neutro Abs: 5.9 10*3/uL (ref 1.7–7.7)
Platelets: 423 10*3/uL — ABNORMAL HIGH (ref 150–400)
RBC: 4.73 MIL/uL (ref 3.87–5.11)
RDW: 14.7 % (ref 11.5–15.5)
WBC: 11 10*3/uL — AB (ref 4.0–10.5)

## 2014-11-06 LAB — C-REACTIVE PROTEIN: CRP: 0.9 mg/dL — AB (ref <0.60)

## 2014-11-06 LAB — SEDIMENTATION RATE: Sed Rate: 40 mm/hr — ABNORMAL HIGH (ref 0–22)

## 2014-11-06 MED ORDER — DOXYCYCLINE HYCLATE 100 MG PO CAPS: 100.0000 mg | ORAL_CAPSULE | Freq: Two times a day (BID) | ORAL | Status: DC

## 2014-11-06 MED ORDER — OXYCODONE HCL 5 MG PO TABS
5.0000 mg | ORAL_TABLET | Freq: Once | ORAL | Status: AC
  Administered 2014-11-06: 5 mg via ORAL
  Filled 2014-11-06: qty 1

## 2014-11-06 MED ORDER — IBUPROFEN 600 MG PO TABS: 600.0000 mg | ORAL_TABLET | Freq: Four times a day (QID) | ORAL | Status: AC | PRN

## 2014-11-06 MED ORDER — TETANUS-DIPHTH-ACELL PERTUSSIS 5-2.5-18.5 LF-MCG/0.5 IM SUSP
0.5000 mL | Freq: Once | INTRAMUSCULAR | Status: AC
  Administered 2014-11-06: 0.5 mL via INTRAMUSCULAR
  Filled 2014-11-06: qty 0.5

## 2014-11-06 MED ORDER — VANCOMYCIN HCL IN DEXTROSE 1-5 GM/200ML-% IV SOLN
1000.0000 mg | Freq: Once | INTRAVENOUS | Status: AC
  Administered 2014-11-06: 1000 mg via INTRAVENOUS
  Filled 2014-11-06: qty 200

## 2014-11-06 MED ORDER — TRAMADOL HCL 50 MG PO TABS: 50.0000 mg | ORAL_TABLET | Freq: Four times a day (QID) | ORAL | Status: DC | PRN

## 2014-11-06 MED ORDER — DIPHENHYDRAMINE HCL 50 MG/ML IJ SOLN
25.0000 mg | Freq: Once | INTRAMUSCULAR | Status: AC
  Administered 2014-11-06: 25 mg via INTRAVENOUS
  Filled 2014-11-06: qty 1

## 2014-11-06 NOTE — ED Notes (Signed)
Pt having pain and swelling to left middle finger.  Pt also tender to nail bed.  Pt has false nails.  Pt removed nail.

## 2014-11-06 NOTE — ED Notes (Signed)
Medication restarted per PA rate changed to 150ML/HR

## 2014-11-06 NOTE — ED Notes (Signed)
Patient notified of plan of care,

## 2014-11-06 NOTE — Discharge Instructions (Signed)
Be sure to soak your finger in warm water and epsom salt 3 times a day for 15 minutes, dry, then apply antibiotic ointment to finger. Apply clean bandage. Be sure to take antibiotics as prescribed. Be sure to complete the entire amount even if feeling better. This will help prevent infection from coming back.  See below for further instructions,

## 2014-11-06 NOTE — ED Notes (Signed)
Called Dr. Brunetta GeneraGramigs office again; woman who I talked to on the phone said she would call him herself. She did say that [facility] and he hasn't returned that phone call either.

## 2014-11-06 NOTE — Consult Note (Signed)
  See dictation #[id number] Amanda PeaGramig MD

## 2014-11-06 NOTE — ED Notes (Signed)
Called Gramig - hand specialist, let him know pt is here.  

## 2014-11-06 NOTE — ED Notes (Signed)
Patient requested a Heat pack since the ice was not helping. PA made aware, and stated okay to give.

## 2014-11-06 NOTE — ED Notes (Signed)
Paged message to Dr.Gramig again.

## 2014-11-06 NOTE — ED Notes (Signed)
Hand specialist at bedside

## 2014-11-06 NOTE — ED Notes (Addendum)
Patient states she started to itch after receiving the VAncomycin. PA made aware, This nurse paused medication.
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
## 2014-11-06 NOTE — ED Notes (Signed)
Called Gramig - hand specialist, let him know pt is here.

## 2014-11-06 NOTE — ED Notes (Signed)
Patient denies any itching at site.

## 2014-11-06 NOTE — ED Notes (Signed)
PA made aware patient would like to speak with her.

## 2014-11-06 NOTE — ED Provider Notes (Signed)
CSN: [id number]     Arrival date & time 11/06/14  1032 History   First MD Initiated Contact with Patient 11/06/14 1206     Chief Complaint  Patient presents with  . Nail Problem     (Consider location/radiation/quality/duration/timing/severity/associated sxs/prior Treatment) Patient is a 44 y.o. female presenting with hand pain. The history is provided by the patient.  Hand Pain This is a new problem. The current episode started yesterday. The problem occurs constantly. The problem has been gradually worsening. Exacerbated by: movement of finger. Treatments tried: tramadol. The treatment provided no relief.   Debra Cardenas is a 44 y.o. female with hx of MRSA presents to the ED with pain and swelling to the middle finger of the left hand. She has had artificial nails that were put on about a month ago. Yesterday she felt a pain under the nail and this morning the pain woke her about 3 am. The pain has been severe since then. She managed to get the artificial nail off but the pain and swelling has gotten worse and now goes from the tip of the finger down to the middle joint.  She took Tramadol without relief.  She is left hand dominant.  Past Medical History  Diagnosis Date  . Anxiety   . Bipolar 1 disorder   . Fibromyalgia   . Depression   . MRSA (methicillin resistant staph aureus) culture positive    Past Surgical History  Procedure Laterality Date  . Cesarean section    . Tubal ligation    . Uterine ablasion    . Abdominal hysterectomy    . Knee arthroplasty    . Ear cyst excision      x3   No family history on file. History  Substance Use Topics  . Smoking status: Never Smoker   . Smokeless tobacco: Not on file  . Alcohol Use: No   OB History    No data available     Review of Systems  Musculoskeletal:       Left middle finger pain and swelling    All other systems negative.   Allergies  Penicillins; Penicillins; Tylenol; and Tylenol  Home Medications    Prior to Admission medications   Medication Sig Start Date End Date Taking? Authorizing Provider  atenolol (TENORMIN) 25 MG tablet Take 25 mg by mouth daily.    Historical Provider, MD  busPIRone (BUSPAR) 10 MG tablet Take 1 tablet (10 mg total) by mouth 2 (two) times daily. 09/24/14   Archer Asa, MD  fluticasone (FLONASE) 50 MCG/ACT nasal spray Place 2 sprays into both nostrils daily.    Historical Provider, MD  pregabalin (LYRICA) 225 MG capsule Take 225 mg by mouth 2 (two) times daily.    Historical Provider, MD  temazepam (RESTORIL) 15 MG capsule 1 qhs  May repeat 09/24/14   Archer Asa, MD   BP 124/56 mmHg  Pulse 78  Temp(Src) 97.8 F (36.6 C) (Oral)  Resp 16  Ht  (1.702 m)  Wt 284 lb 6.4 oz (129.003 kg)  BMI 44.53 kg/m2  SpO2 98% Physical Exam  Constitutional: She is oriented to person, place, and time. She appears well-developed and well-nourished.  HENT:  Head: Normocephalic and atraumatic.  Eyes: EOM are normal.  Neck: Neck supple.  Cardiovascular: Normal rate.   Pulmonary/Chest: Effort normal.  Musculoskeletal:       Left hand: She exhibits tenderness and swelling. She exhibits normal capillary refill, no deformity and no laceration. Decreased  range of motion: due to pain and swelling. Normal sensation noted. Normal strength noted.       Hands: Radial pulse strong, adequate circulation.  Neurological: She is alert and oriented to person, place, and time. No cranial nerve deficit.  Skin: Skin is warm and dry.  Psychiatric: She has a normal mood and affect. Her behavior is normal.  Nursing note and vitals reviewed.  Dg Finger Middle Left  11/06/2014   CLINICAL DATA:  Nail problem, cannot extend the left middle finger.  EXAM: LEFT MIDDLE FINGER 2+V  COMPARISON:  None.  FINDINGS: There is no evidence of fracture or dislocation. There is no evidence of arthropathy or other focal bone abnormality. Soft tissues are unremarkable.  IMPRESSION: Negative.    Electronically Signed   By: Marnee SpringJonathon  Watts M.D.   On: 11/06/2014 14:03    Results for orders placed or performed during the hospital encounter of 11/06/14 (from the past 24 hour(s))  CBC with Differential/Platelet     Status: Abnormal   Collection Time: 11/06/14  1:52 PM  Result Value Ref Range   WBC 11.0 (H) 4.0 - 10.5 K/uL   RBC 4.73 3.87 - 5.11 MIL/uL   Hemoglobin 13.0 12.0 - 15.0 g/dL   HCT 14.740.0 82.936.0 - 56.246.0 %   MCV 84.6 78.0 - 100.0 fL   MCH 27.5 26.0 - 34.0 pg   MCHC 32.5 30.0 - 36.0 g/dL   RDW 13.014.7 86.511.5 - 78.415.5 %   Platelets 423 (H) 150 - 400 K/uL   Neutrophils Relative % 54 43 - 77 %   Neutro Abs 5.9 1.7 - 7.7 K/uL   Lymphocytes Relative 33 12 - 46 %   Lymphs Abs 3.6 0.7 - 4.0 K/uL   Monocytes Relative 12 3 - 12 %   Monocytes Absolute 1.4 (H) 0.1 - 1.0 K/uL   Eosinophils Relative 1 0 - 5 %   Eosinophils Absolute 0.1 0.0 - 0.7 K/uL   Basophils Relative 0 0 - 1 %   Basophils Absolute 0.0 0.0 - 0.1 K/uL    ED Course  Procedures  @ 13:35 consult with Dr. Amanda PeaGramig and [facility].  [facility] patient [facility].  MDM  44 y.o. female with pain and swelling to the left middle finger x 2 days. Stable for [facility] and Dr. Amanda PeaGramig will evaluate her there.   Final diagnoses:  Felon, left       [facility] Takeyah Wieman, NP 11/06/14 1434  Rolan BuccoMelanie Belfi, MD 11/06/14 1520

## 2014-11-06 NOTE — ED Notes (Signed)
Patient given ice to place on Pt finger per PA request and family given coke.

## 2014-11-07 NOTE — Consult Note (Signed)
NAME:  Debra NoonWASHINGTON, Debra Cardenas             ACCOUNT NO.:  [id number]  MEDICAL RECORD NO.:  [id number]  LOCATION:                                 FACILITY:  PHYSICIAN:  Dionne AnoWilliam M. Hoang Pettingill, M.D.DATE OF BIRTH:  1970/11/17  DATE OF CONSULTATION:  11/06/2014 DATE OF DISCHARGE:  11/06/2014                                CONSULTATION   HISTORY OF PRESENT ILLNESS:  I was asked to see Debra Cardenas today for consultation.  Ms.  Debra Cardenas is a pleasant female who has a predicament about her hand.  [facility] was seen [facility] and referred for further care.  I have discussed her issues at length and the findings.  At present time, the patient complains that she began having swelling in her finger after some artificial nails were put on.  This is a left middle finger in question.  [facility].  It was felt that she may require surgical intervention.  Thus, she presents for evaluation.  I have discussed these issues with her at length.  She took the nail off herself.  She states she had a history of MRSA in her fascial region in the past.  She does not do well on clindamycin.  I have reviewed these issues at length.  She states that she is not having __________ pain, but is generally painful about the middle finger.  PAST MEDICAL HISTORY: 1. Anxiety. 2. Bipolar disorder. 3. Fibromyalgia. 4. Depression. 5. History of MRSA .  PAST SURGICAL HISTORY: 1. C-section. 2. Tubal ligation. 3. Uterine ablation. 4. Abdominal hysterectomy. 5. Knee arthroscopy. 6. Cyst excision in her ear.  SOCIAL HISTORY:  She does not drink.  She does not smoke.  ALLERGIES:  PENICILLIN, TYLENOL.  I reviewed all issues at length including her medicines at home.  PHYSICAL EXAMINATION:  GENERAL:  She is a pleasant female, alert, oriented, in no acute distress.  She has some mild swelling at best in her middle finger.  Sensitive over the pulp, but it is not tense.   There is no evidence of paronychia.  There is no evidence of instability. There is no cellulitic change in the hand.  She has normal refill, normal sensation, and I did not palpate a fluctuant abscess. Extremities neurovascularly intact. She is obese. ABDOMEN:  Nontender, nondistended. CHEST:  Clear. EXTREMITIES:  Lower extremity examination is benign.  X-rays have been performed and reviewed today.  Her AP and lateral of the middle finger does not show any evidence of osteomyelitis or other features.  In addition to this, we have requested labs that were performed at [facility].  She has a white blood cell count, which is 11.  The patient has a sed rate, which is 40.  Her C. reactive protein is in progress.  I have discussed these issues at length and the findings.  IMPRESSION:  Soft tissue swelling and cellulitic change about the left middle finger without obvious abscess.  PLAN:  I have discussed her findings.  We are going to place her on doxycycline.  In addition, given the history of MRSA , we are going to give her a gram  of vancomycin prior to leaving the ER.  We do not feel fluctuant area; however, I would give this elevation time, warm soaks, Neosporin applications, and see how this declares itself into the future.  If this worsens, then certainly we would consider a formal I and D, however, all parties concurred and most importantly myself that she is not absolutely in need of a surgical I and D.  I think we have to treat it with aggressive antibiotic care. Give this time to declare itself and move forward to higher 2 level of intervention as the symptomatology dictates.  These notes have been discussed.  All questions have been encouraged and answered.  Once again, antibiotics, warm soaks, Neosporin, and see me in 48 hours.  It is pleasure to see her today and initiate her care plan.  Should any problems arise, we will be immediately  available.     Dionne Ano. Amanda Pea, M.D.     [facility]  D:  11/06/2014  T:  11/07/2014  Job:  [id number]

## 2014-11-12 ENCOUNTER — Ambulatory Visit (INDEPENDENT_AMBULATORY_CARE_PROVIDER_SITE_OTHER): Payer: 59 | Admitting: Psychiatry

## 2014-11-12 VITALS — BP 149/85 | HR 98 | Ht 67.0 in | Wt 286.2 lb

## 2014-11-12 DIAGNOSIS — F4323 Adjustment disorder with mixed anxiety and depressed mood: Secondary | ICD-10-CM

## 2014-11-14 NOTE — Progress Notes (Signed)
   THERAPIST PROGRESS NOTE Session Time: 2:43-3:40   Participation Level: Active   Behavioral Response: CasualAlert/Depressed/Overwhelmed  Type of Therapy: Individual Therapy   Treatment Goals addressed: emotion regulation, coping, stress management   Interventions: CBT   Summary: Ether Griffinsoni W Washington is a 44 y.o. female who presents with anxiety, depression.   Suicidal/Homicidal: Nowithout intent/plan   Therapist Response: Pt. Presents with sadness and reporting feeling generally overwhelmed. Pt. Reports that she and her daughters had the flu, [place] and was disappointed by reception she received from her family, mother became very sick and was hospitalized and after discharge from hospital was informed that she was fired from her job. Pt. Reports that she withdrew from two of her classes because she felt overwhelmed but felt guilt and sadness because the withdrawal will further delay her graduation. Pt. Was encouraged to focus on her self-care (i.e., sleep, nutritious food, moderate exercise). Pt. Was introduced to serenita app to work on breathing and was encouraged to journal to process feelings between sessions.   Plan: Pt. To continue with CBT based therapy. Return again in 2 weeks.   Diagnosis: Axis I: Depressive Disorder NOS   Axis II: No diagnosis    Shaune PollackBrown, Gerilynn Mccullars B, [facility] 11/14/2014

## 2014-11-26 ENCOUNTER — Ambulatory Visit (INDEPENDENT_AMBULATORY_CARE_PROVIDER_SITE_OTHER): Payer: 59 | Admitting: Psychiatry

## 2014-11-26 DIAGNOSIS — F4323 Adjustment disorder with mixed anxiety and depressed mood: Secondary | ICD-10-CM
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
## 2014-12-10 ENCOUNTER — Ambulatory Visit (INDEPENDENT_AMBULATORY_CARE_PROVIDER_SITE_OTHER): Payer: 59 | Admitting: Psychiatry

## 2014-12-10 DIAGNOSIS — F4323 Adjustment disorder with mixed anxiety and depressed mood: Secondary | ICD-10-CM
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
## 2014-12-24 ENCOUNTER — Ambulatory Visit (INDEPENDENT_AMBULATORY_CARE_PROVIDER_SITE_OTHER): Payer: 59 | Admitting: Psychiatry

## 2014-12-24 VITALS — BP 151/80 | HR 93 | Ht 67.0 in | Wt 290.2 lb

## 2014-12-24 DIAGNOSIS — F4323 Adjustment disorder with mixed anxiety and depressed mood: Secondary | ICD-10-CM

## 2014-12-24 MED ORDER — TRAZODONE HCL 100 MG PO TABS: ORAL_TABLET | ORAL | Status: DC

## 2014-12-24 MED ORDER — BUSPIRONE HCL 10 MG PO TABS: 10.0000 mg | ORAL_TABLET | Freq: Two times a day (BID) | ORAL | Status: DC
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
## 2014-12-30 ENCOUNTER — Telehealth (HOSPITAL_COMMUNITY): Payer: Self-pay | Admitting: *Deleted

## 2014-12-30 NOTE — Telephone Encounter (Signed)
Called [facility] to ask about medication authorization for Buspirone. Wanted to know if all other medications were being denied. Was told that patient no longer gets medications filled there due to her Medicaid coverage changing and that [facility]. They state the authorization that was faxed was old and to disregard.

## 2014-12-30 NOTE — Telephone Encounter (Signed)
Called for prior authorization of Buspirone. Was told that patient only has family planning coverage now and this does not cover pharmacy medications. Was told her regular Medicaid ended in 2015. Will follow up with office to verify patients insurance has not changed or needs updated.

## 2015-01-01 ENCOUNTER — Ambulatory Visit (INDEPENDENT_AMBULATORY_CARE_PROVIDER_SITE_OTHER): Payer: 59 | Admitting: Psychiatry

## 2015-01-01 DIAGNOSIS — F4323 Adjustment disorder with mixed anxiety and depressed mood: Secondary | ICD-10-CM
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
## 2015-01-14 ENCOUNTER — Ambulatory Visit (HOSPITAL_COMMUNITY): Payer: Self-pay | Admitting: Psychiatry

## 2015-01-28 ENCOUNTER — Ambulatory Visit (HOSPITAL_COMMUNITY): Payer: Self-pay | Admitting: Psychiatry

## 2015-02-20 ENCOUNTER — Ambulatory Visit (HOSPITAL_COMMUNITY): Payer: Self-pay | Admitting: Psychiatry

## 2015-03-04 ENCOUNTER — Ambulatory Visit (HOSPITAL_COMMUNITY): Payer: Self-pay | Admitting: Psychiatry

## 2015-03-18 ENCOUNTER — Ambulatory Visit (HOSPITAL_COMMUNITY): Payer: Self-pay | Admitting: Psychiatry

## 2015-04-01 ENCOUNTER — Ambulatory Visit (INDEPENDENT_AMBULATORY_CARE_PROVIDER_SITE_OTHER): Payer: 59 | Admitting: Psychiatry

## 2015-04-01 DIAGNOSIS — F4323 Adjustment disorder with mixed anxiety and depressed mood: Secondary | ICD-10-CM | POA: Diagnosis not present
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
## 2015-04-15 ENCOUNTER — Ambulatory Visit (HOSPITAL_COMMUNITY): Payer: Self-pay | Admitting: Psychiatry

## 2015-04-16 ENCOUNTER — Other Ambulatory Visit (HOSPITAL_COMMUNITY): Payer: Self-pay | Admitting: Psychiatry

## 2015-04-16 DIAGNOSIS — F4323 Adjustment disorder with mixed anxiety and depressed mood: Secondary | ICD-10-CM

## 2015-04-29 ENCOUNTER — Ambulatory Visit (HOSPITAL_COMMUNITY): Payer: Self-pay | Admitting: Psychiatry

## 2015-05-06 NOTE — Telephone Encounter (Signed)
Met with Dr. Casimiro Needle who authorized a new order for patient's prescribed Restoril plus 1 refill but to also add notation patient needs to schedule an appointment for further refills. New order plus one refill of patient's Restoril printed out by mistake so called in new order to patient's [place] in [facility] with [place] who requested order be left on their prescription line. New order left on prescription line with additional instruction evaluation required for further refills. Printed prescription voided.

## 2015-06-29 ENCOUNTER — Ambulatory Visit (INDEPENDENT_AMBULATORY_CARE_PROVIDER_SITE_OTHER): Payer: Self-pay | Admitting: Psychiatry

## 2015-06-29 DIAGNOSIS — F4323 Adjustment disorder with mixed anxiety and depressed mood: Secondary | ICD-10-CM
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
## 2015-07-13 ENCOUNTER — Ambulatory Visit (HOSPITAL_COMMUNITY): Payer: Self-pay | Admitting: Psychiatry

## 2015-07-27 ENCOUNTER — Ambulatory Visit (HOSPITAL_COMMUNITY): Payer: Self-pay | Admitting: Psychiatry

## 2015-08-10 ENCOUNTER — Ambulatory Visit (INDEPENDENT_AMBULATORY_CARE_PROVIDER_SITE_OTHER): Payer: 59 | Admitting: Psychiatry

## 2015-08-10 DIAGNOSIS — F4323 Adjustment disorder with mixed anxiety and depressed mood: Secondary | ICD-10-CM
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
## 2015-08-24 ENCOUNTER — Ambulatory Visit (HOSPITAL_COMMUNITY): Payer: Self-pay | Admitting: Psychiatry

## 2015-09-07 ENCOUNTER — Ambulatory Visit (INDEPENDENT_AMBULATORY_CARE_PROVIDER_SITE_OTHER): Payer: 59 | Admitting: Psychiatry

## 2015-09-07 DIAGNOSIS — F4323 Adjustment disorder with mixed anxiety and depressed mood: Secondary | ICD-10-CM

## 2015-09-09 NOTE — Progress Notes (Signed)
   THERAPIST PROGRESS NOTE  Session Time: 2:05-2:55  Participation Level: Active   Behavioral Response: CasualAlert/Euthymic  Type of Therapy: Individual Therapy   Treatment Goals addressed: emotion regulation, coping, stress management   Interventions: CBT   Summary: Debra Cardenas is a 44 y.o. female who presents with anxiety, depression.   Suicidal/Homicidal: Nowithout intent/plan   Therapist Response: Pt. Presents with significantly improved mood compared to last session. Pt. Talked and laughed appropriately and expressed hopefulness as it related to her relationships with her mother and with her children. Pt. Reports that she has been tired, but that her depression has been much better. Pt. Attributes improvements to ability to set more rigid boundaries with her mother who she cites as her most significant stressor. Pt. Discussed her mother's attitude of entitlement and believing that Pt. Is responsible for helping her to maintain a lifestyle that she cannot afford. Pt. Reports that she was able to set healthier boundaries with money and committing to cooking some items but not all of them for the Thanksgiving dinner. Pt. 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   Diagnosis: Axis I: Depressive Disorder NOS   Axis II: No diagnosis  Plan: Pt. To continue with CBT based therapy. Pt. To return in 2-4 weeks.  Shaune PollackBrown, Fawnda Vitullo B, [facility] 09/09/2015

## 2015-09-10 ENCOUNTER — Ambulatory Visit (INDEPENDENT_AMBULATORY_CARE_PROVIDER_SITE_OTHER): Payer: 59 | Admitting: Psychiatry

## 2015-09-10 VITALS — BP 136/75 | HR 78 | Ht 67.0 in | Wt 289.0 lb

## 2015-09-10 DIAGNOSIS — F4323 Adjustment disorder with mixed anxiety and depressed mood: Secondary | ICD-10-CM | POA: Diagnosis not present

## 2015-09-10 MED ORDER — TRAZODONE HCL 100 MG PO TABS: ORAL_TABLET | ORAL | Status: DC

## 2015-09-10 MED ORDER — BUSPIRONE HCL 10 MG PO TABS: ORAL_TABLET | ORAL | Status: DC

## 2015-09-10 NOTE — Progress Notes (Signed)
[facility] Progress Note  09/10/2015 2:36 PM [facility]  MRN:  [id number] Subjective:  Good spirits Principal Problem: Adjustment disorder with anxious mood state Diagnosis:  Adjustment disorder with anxious mood state Today the patient starts off by being upset for an 20 minutes late. She says it always happens. He tries not to take it personally she does. I shared with her I recommend that she make an appointment first thing when I come in their is less likely we would start late. I shared with him that other people today because of the holidays are not doing so great. I shared with her that I sometimes have to spend more time than scheduled with patient's and I would do the same thing for her. She seemed to calm down and started to engage. Generally she is doing well. She takes care of her grandmother is moved here from the patient continues to be at [employer] no get an undergraduate degree and go to [employer] for criminal science. The patient wants to work at a criminal lab. The patient is doing well. She denies daily depression. She says her anxiety is fairly well controlled. The patient does not track any alcohol or use any drugs. Patient says she's lost a little weight and she's pleased with herself. She is active and walking. She drinks no alcohol. She uses no drugs. She watches TV enjoys her life. She looks well. She's very engaging and friendly. Patient Active Problem List   Diagnosis Date Noted  . 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 Generalized anxiety disorder [F41.1] 01/18/2012   Total Time spent with patient: 15 minutes  Past Psychiatric History:   Past Medical History:  Past Medical History  Diagnosis Date  . Anxiety   . Bipolar 1 disorder   . Fibromyalgia   . Depression   . MRSA (methicillin resistant staph aureus) culture positive     Past Surgical History  Procedure Laterality Date  . Cesarean section    . Tubal ligation    . Uterine ablasion    .  Abdominal hysterectomy    . Knee arthroplasty    . Ear cyst excision      x3   Family History: No family history on file. Family Psychiatric  History:  Social History:  History  Alcohol Use No     History  Drug Use No    Social History   Social History  . Marital Status: Married    Spouse Name: N/A  . Number of Children: N/A  . Years of Education: N/A   Social History Main Topics  . Smoking status: Never Smoker   . Smokeless tobacco: Not on file  . Alcohol Use: No  . Drug Use: No  . Sexual Activity: Not on file   Other Topics Concern  . Not on file   Social History Narrative   ** Merged History Encounter **       Additional Social History:                         Sleep: Good  Appetite:  Good  Current Medications: Current Outpatient Prescriptions  Medication Sig Dispense Refill  . atenolol (TENORMIN) 25 MG tablet Take 25 mg by mouth daily.    . busPIRone (BUSPAR) 10 MG tablet 2  bid 120 tablet 6  . doxycycline (VIBRAMYCIN) 100 MG capsule Take 1 capsule (100 mg total) by mouth 2 (two) times daily. One po  bid x 10 days 20 capsule 0  . fluticasone (FLONASE) 50 MCG/ACT nasal spray Place 2 sprays into both nostrils daily.    Marland Kitchen ibuprofen (ADVIL,MOTRIN) 600 MG tablet Take 1 tablet (600 mg total) by mouth every 6 (six) hours as needed. 30 tablet 0  . pregabalin (LYRICA) 225 MG capsule Take 225 mg by mouth 2 (two) times daily.    . temazepam (RESTORIL) 15 MG capsule TAKE ONE CAPSULE BY MOUTH AT BEDTIME MAY  REPEAT  ONCE  -  TAKE  NO  MORE  THAN  2  CAPSULES  PER  DAY 60 capsule 1  . traMADol (ULTRAM) 50 MG tablet Take 1 tablet (50 mg total) by mouth every 6 (six) hours as needed. 15 tablet 0  . traZODone (DESYREL) 100 MG tablet 1 qhs  May repeat 60 tablet 5   No current facility-administered medications for this visit.    Lab Results: No results found for this or any previous visit (from the past 48 hour(s)).  Physical Findings: AIMS:  , ,  ,  ,    CIWA:     COWS:     Musculoskeletal: Strength & Muscle Tone: within normal limits Gait & Station: normal Patient leans: Right  Psychiatric Specialty Exam: ROS  Blood pressure 136/75, pulse 78, height  (1.702 m), weight 289 lb (131.09 kg).Body mass index is 45.25 kg/(m^2).  General Appearance: Casual  Eye Contact::  Good  Speech:  Clear and Coherent  Volume:  Normal  Mood:  NA  Affect:  NA  Thought Process:  Coherent  Orientation:  Full (Time, Place, and Person)  Thought Content:  WDL  Suicidal Thoughts:  No  Homicidal Thoughts:  No  Memory:  Negative  Judgement:  Good  Insight:  Good  Psychomotor Activity:  Normal  Concentration:  Good  Recall:  Good  Fund of Knowledge:Good  Language: Good  Akathisia:  No  Handed:  Right  AIMS (if indicated):     Assets:  Desire for Improvement  ADL's:  Intact  Cognition: WNL  Sleep:      Treatment Plan Summary: At this time the patient is doing well. She has mild anxiety so we will go ahead and increase her BuSpar to what I will consider to be the highest dose of 20 mg twice a day. The patient is doing well on trazodone is helping her sleep better. The patient is very stable. The patient wishes to start on time but unfortunately this can always be the case. This patient she'll return to see me in 5 months for a 30 minute visit. She'll continue in her therapy with Jennifer brown. She seems to be benefiting.  Lucas Mallow 09/10/2015, 2:36 PM

## 2015-09-22 ENCOUNTER — Ambulatory Visit (HOSPITAL_COMMUNITY): Payer: Self-pay | Admitting: Psychiatry

## 2015-09-30 ENCOUNTER — Encounter (HOSPITAL_BASED_OUTPATIENT_CLINIC_OR_DEPARTMENT_OTHER): Payer: Self-pay | Admitting: Emergency Medicine

## 2015-09-30 ENCOUNTER — Emergency Department (HOSPITAL_BASED_OUTPATIENT_CLINIC_OR_DEPARTMENT_OTHER)
Admission: EM | Admit: 2015-09-30 | Discharge: 2015-09-30 | Disposition: A | Discharge status: Home / Self Care | Payer: Self-pay | Attending: Emergency Medicine | Admitting: Emergency Medicine

## 2015-09-30 DIAGNOSIS — Z8679 Personal history of other diseases of the circulatory system: Secondary | ICD-10-CM | POA: Insufficient documentation

## 2015-09-30 DIAGNOSIS — F419 Anxiety disorder, unspecified: Secondary | ICD-10-CM | POA: Insufficient documentation

## 2015-09-30 DIAGNOSIS — G8929 Other chronic pain: Secondary | ICD-10-CM | POA: Insufficient documentation

## 2015-09-30 DIAGNOSIS — Z880 Allergy status to penicillin: Secondary | ICD-10-CM | POA: Insufficient documentation

## 2015-09-30 DIAGNOSIS — Z8614 Personal history of Methicillin resistant Staphylococcus aureus infection: Secondary | ICD-10-CM | POA: Insufficient documentation

## 2015-09-30 DIAGNOSIS — R519 Headache, unspecified: Secondary | ICD-10-CM

## 2015-09-30 DIAGNOSIS — Z79899 Other long term (current) drug therapy: Secondary | ICD-10-CM | POA: Insufficient documentation

## 2015-09-30 DIAGNOSIS — R51 Headache: Secondary | ICD-10-CM | POA: Insufficient documentation

## 2015-09-30 DIAGNOSIS — Z7951 Long term (current) use of inhaled steroids: Secondary | ICD-10-CM | POA: Insufficient documentation

## 2015-09-30 DIAGNOSIS — F329 Major depressive disorder, single episode, unspecified: Secondary | ICD-10-CM | POA: Insufficient documentation

## 2015-09-30 DIAGNOSIS — H7291 Unspecified perforation of tympanic membrane, right ear: Secondary | ICD-10-CM | POA: Insufficient documentation

## 2015-09-30 HISTORY — DX: Other chronic pain: G89.29

## 2015-09-30 HISTORY — DX: Morbid (severe) obesity due to excess calories: E66.01

## 2015-09-30 HISTORY — DX: Opioid dependence, uncomplicated: F11.20

## 2015-09-30 HISTORY — DX: Migraine, unspecified, not intractable, without status migrainosus: G43.909

## 2015-09-30 HISTORY — DX: Adjustment disorder with mixed anxiety and depressed mood: F43.23

## 2015-09-30 HISTORY — DX: Reserved for inherently not codable concepts without codable children: IMO0001

## 2015-09-30 MED ORDER — KETOROLAC TROMETHAMINE 30 MG/ML IJ SOLN
30.0000 mg | Freq: Once | INTRAMUSCULAR | Status: AC
  Administered 2015-09-30: 30 mg via INTRAVENOUS
  Filled 2015-09-30: qty 1

## 2015-09-30 MED ORDER — DIPHENHYDRAMINE HCL 50 MG/ML IJ SOLN
25.0000 mg | Freq: Once | INTRAMUSCULAR | Status: AC
  Administered 2015-09-30: 25 mg via INTRAVENOUS
  Filled 2015-09-30: qty 1

## 2015-09-30 MED ORDER — SODIUM CHLORIDE 0.9 % IV BOLUS (SEPSIS)
1000.0000 mL | Freq: Once | INTRAVENOUS | Status: AC
Start: 2015-09-30 — End: 2015-09-30
  Administered 2015-09-30: 1000 mL via INTRAVENOUS

## 2015-09-30 MED ORDER — METOCLOPRAMIDE HCL 5 MG/ML IJ SOLN
10.0000 mg | Freq: Once | INTRAMUSCULAR | Status: AC
  Administered 2015-09-30: 10 mg via INTRAVENOUS
  Filled 2015-09-30: qty 2

## 2015-09-30 NOTE — ED Provider Notes (Addendum)
CSN: [id number]     Arrival date & time 09/30/15  0209 History   First MD Initiated Contact with Patient 09/30/15 [phone]     Chief Complaint  Patient presents with  . Headache     (Consider location/radiation/quality/duration/timing/severity/associated sxs/prior Treatment) HPI  This is a 45 year old female who reports several weeks of cough, nasal congestion and malaise. She is here complaining of a headache for the past 6 days. The headache is located frontally and on the right side of her head. It is similar to previous migraines and she rates it as a 10 out of 10. She has not gotten relief from her home medications. She is having photophobia but no nausea or vomiting. She has had diarrhea. She is also having drainage from her right ear. She has had right ear surgery in the past.   Past Medical History  Diagnosis Date  . Anxiety   . Bipolar 1 disorder (HCC)   . Fibromyalgia   . Depression   . MRSA (methicillin resistant staph aureus) culture positive   . Migraine   . Adjustment disorder with mixed anxiety and depressed mood   . Opiate dependence (HCC)   . Chronic pain   . Myalgia and myositis   . Morbid obesity [facility])    Past Surgical History  Procedure Laterality Date  . Cesarean section    . Tubal ligation    . Uterine ablasion    . Abdominal hysterectomy    . Knee arthroplasty    . Ear cyst excision      x3   No family history on file. Social History  Substance Use Topics  . Smoking status: Never Smoker   . Smokeless tobacco: None  . Alcohol Use: No   OB History    No data available     Review of Systems  All other systems reviewed and are negative.  Allergies  Penicillins and Tylenol  Home Medications   Prior to Admission medications   Medication Sig Start Date End Date Taking? Authorizing Provider  oxycodone (OXY-IR) 5 MG capsule Take 10 mg by mouth every 4 (four) hours as needed.   Yes Historical Provider, MD  atenolol (TENORMIN) 25 MG tablet Take 25 mg  by mouth daily.    Historical Provider, MD  busPIRone (BUSPAR) 10 MG tablet 2  bid 09/10/15   Archer Asa, MD  doxycycline (VIBRAMYCIN) 100 MG capsule Take 1 capsule (100 mg total) by mouth 2 (two) times daily. One po bid x 10 days 11/06/14   Junius Finner, PA-C  fluticasone [facility]) 50 MCG/ACT nasal spray Place 2 sprays into both nostrils daily.    Historical Provider, MD  ibuprofen (ADVIL,MOTRIN) 600 MG tablet Take 1 tablet (600 mg total) by mouth every 6 (six) hours as needed. 11/06/14   Junius Finner, PA-C  pregabalin (LYRICA) 225 MG capsule Take 225 mg by mouth 2 (two) times daily.    Historical Provider, MD  temazepam (RESTORIL) 15 MG capsule TAKE ONE CAPSULE BY MOUTH AT BEDTIME MAY  REPEAT  ONCE  -  TAKE  NO  MORE  THAN  2  CAPSULES  PER  DAY 05/06/15   Archer Asa, MD  traMADol (ULTRAM) 50 MG tablet Take 1 tablet (50 mg total) by mouth every 6 (six) hours as needed. 11/06/14   Junius Finner, PA-C  traZODone (DESYREL) 100 MG tablet 1 qhs  May repeat 09/10/15   Archer Asa, MD   BP 109/53 mmHg  Pulse 83  Temp(Src) 98.4 F (  36.9 C) (Oral)  Resp 16  Ht 5\' 7"  (1.702 m)  Wt 279 lb (126.554 kg)  BMI 43.69 kg/m2  SpO2 100%   Physical Exam  General: Well-developed, obese female in no acute distress; appearance consistent with age of record HENT: normocephalic; atraumatic; left TM normal, right TM perforation with clear fluid in external auditory canal Eyes: pupils equal, round and reactive to light; extraocular muscles intact Neck: supple Heart: regular rate and rhythm Lungs: clear to auscultation bilaterally Abdomen: soft; nondistended; nontender Extremities: No deformity; full range of motion Neurologic: Awake, alert and oriented; motor function intact in all extremities and symmetric; no facial droop Skin: Warm and dry Psychiatric: Normal mood and affect    ED Course  Procedures (including critical care time)   MDM  4:02 AM Headache improved after IV fluids and  medications. The drainage from her apparently acutely ruptured right tympanic membrane is clear and I do not feel that antibiotics are indicated at this time.   Paula LibraJohn Nidya Bouyer, MD 09/30/15 [id number]  Paula LibraJohn Aili Casillas, MD 09/30/15 [id number]

## 2015-09-30 NOTE — Discharge Instructions (Signed)
Eardrum Perforation  An eardrum perforation is a puncture or tear in the eardrum. This is also called a ruptured eardrum. The eardrum is a thin, round tissue inside of your ear that separates your ear canal from your middle ear. This is the tissue that detects sound and enables you to hear. An eardrum perforation can cause discomfort and hearing loss. In most cases, the eardrum will heal without treatment and with little or no permanent hearing loss.  CAUSES  An eardrum perforation can result from different causes, including:  · Sudden pressure changes that happen in situations such as scuba diving or flying in an airplane.  · Foreign objects in the ear.  · Inserting a cotton-tipped swab or any blunt object into the ear.  · Loud noise.  · Trauma to the ear.  · Attempting to remove an object from the ear.  SIGNS AND SYMPTOMS  · Hearing loss.  · Ear pain.  · Ringing in the ear.  · Discharge or bleeding from the ear.  · Dizziness.  · Vomiting.  · Facial paralysis.  DIAGNOSIS   Your health care provider will examine your ear using a tool called an otoscope. This tool allows the health care provider to see into your ear to examine your eardrum. Various types of hearing tests may also be done.  TREATMENT   Typically, the eardrum will heal on its own within a few weeks. If your eardrum does not heal, your health care provider may recommend one of the following treatments:  · A procedure to place a patch over your eardrum.  · Surgery to repair your eardrum.  HOME CARE INSTRUCTIONS   · Keep your ear dry. This will improve healing. Do not submerge your head under water until healing is complete. Do not swim or dive until your health care provider approves. While bathing or showering, protect your ear using one of these methods:    Using a waterproof earplug.    Covering a piece of cotton with petroleum jelly and placing it in your outer ear canal.  · Take medicines only as directed by your health care provider.  · Avoid  blowing your nose if possible. If you blow your nose, do it gently. Forceful blowing increases the pressure in your middle ear. This may cause further injury or may delay your healing.  · Resume your normal activities after the perforation has healed. Your health care provider can tell you when this has occurred.  · Talk to your health care provider before you fly on an airplane. Air travel is generally allowed with a perforated eardrum.  · Keep all follow-up visits as directed by your health care provider. This is important.  SEEK MEDICAL CARE IF:  · You have a fever.  SEEK IMMEDIATE MEDICAL CARE IF:  · You have blood or pus coming from your ear.  · You have dizziness or problems with balance.  · You have nausea or vomiting.  · You have increased pain.     This information is not intended to replace advice given to you by your health care provider. Make sure you discuss any questions you have with your health care provider.     Document Released: 09/02/2000 Document Revised: 09/26/2014 Document Reviewed: 04/14/2014  Elsevier Interactive Patient Education ©2016 [employer]

## 2015-09-30 NOTE — ED Notes (Signed)
Pt reports cough and congestion x several weeks. Came to ED tonight due to headache and drainage from right ear. Pt has hx of migraines.

## 2015-10-06 ENCOUNTER — Ambulatory Visit (HOSPITAL_COMMUNITY): Payer: Self-pay | Admitting: Psychiatry

## 2015-10-20 ENCOUNTER — Ambulatory Visit (HOSPITAL_COMMUNITY): Payer: Self-pay | Admitting: Psychiatry

## 2015-11-03 ENCOUNTER — Ambulatory Visit (HOSPITAL_COMMUNITY): Payer: Self-pay | Admitting: Psychiatry

## 2015-11-17 ENCOUNTER — Ambulatory Visit (HOSPITAL_COMMUNITY): Payer: Self-pay | Admitting: Psychiatry

## 2016-02-10 ENCOUNTER — Ambulatory Visit (INDEPENDENT_AMBULATORY_CARE_PROVIDER_SITE_OTHER): Payer: 59 | Admitting: Psychiatry

## 2016-02-10 ENCOUNTER — Encounter (HOSPITAL_COMMUNITY): Payer: Self-pay | Admitting: Psychiatry

## 2016-02-10 VITALS — BP 132/78 | HR 99 | Ht 67.0 in | Wt 289.8 lb

## 2016-02-10 DIAGNOSIS — F4323 Adjustment disorder with mixed anxiety and depressed mood: Secondary | ICD-10-CM

## 2016-02-10 MED ORDER — TRAZODONE HCL 100 MG PO TABS: ORAL_TABLET | ORAL | Status: DC

## 2016-02-10 MED ORDER — BUSPIRONE HCL 10 MG PO TABS: ORAL_TABLET | ORAL | Status: DC
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
## 2016-05-16 ENCOUNTER — Encounter (HOSPITAL_BASED_OUTPATIENT_CLINIC_OR_DEPARTMENT_OTHER): Payer: Self-pay | Admitting: *Deleted

## 2016-05-16 ENCOUNTER — Emergency Department (HOSPITAL_BASED_OUTPATIENT_CLINIC_OR_DEPARTMENT_OTHER)
Admission: EM | Admit: 2016-05-16 | Discharge: 2016-05-16 | Disposition: A | Discharge status: Home / Self Care | Payer: Self-pay | Attending: Emergency Medicine | Admitting: Emergency Medicine

## 2016-05-16 DIAGNOSIS — R51 Headache: Secondary | ICD-10-CM

## 2016-05-16 DIAGNOSIS — Z791 Long term (current) use of non-steroidal anti-inflammatories (NSAID): Secondary | ICD-10-CM | POA: Insufficient documentation

## 2016-05-16 DIAGNOSIS — R519 Headache, unspecified: Secondary | ICD-10-CM

## 2016-05-16 DIAGNOSIS — J3489 Other specified disorders of nose and nasal sinuses: Secondary | ICD-10-CM | POA: Insufficient documentation

## 2016-05-16 DIAGNOSIS — H66004 Acute suppurative otitis media without spontaneous rupture of ear drum, recurrent, right ear: Secondary | ICD-10-CM | POA: Insufficient documentation

## 2016-05-16 DIAGNOSIS — Z79899 Other long term (current) drug therapy: Secondary | ICD-10-CM | POA: Insufficient documentation

## 2016-05-16 MED ORDER — DEXAMETHASONE SODIUM PHOSPHATE 10 MG/ML IJ SOLN
10.0000 mg | Freq: Once | INTRAMUSCULAR | Status: AC
  Administered 2016-05-16: 10 mg via INTRAVENOUS
  Filled 2016-05-16: qty 1

## 2016-05-16 MED ORDER — AZITHROMYCIN 250 MG PO TABS: 250.0000 mg | ORAL_TABLET | Freq: Every day | ORAL | 0 refills | Status: AC

## 2016-05-16 MED ORDER — AZITHROMYCIN 250 MG PO TABS
500.0000 mg | ORAL_TABLET | Freq: Once | ORAL | Status: AC
  Administered 2016-05-16: 500 mg via ORAL
  Filled 2016-05-16: qty 2

## 2016-05-16 MED ORDER — DIPHENHYDRAMINE HCL 50 MG/ML IJ SOLN
25.0000 mg | Freq: Once | INTRAMUSCULAR | Status: AC
  Administered 2016-05-16: 25 mg via INTRAVENOUS
  Filled 2016-05-16: qty 1

## 2016-05-16 MED ORDER — PROCHLORPERAZINE EDISYLATE 5 MG/ML IJ SOLN
10.0000 mg | Freq: Once | INTRAMUSCULAR | Status: AC
  Administered 2016-05-16: 10 mg via INTRAVENOUS
  Filled 2016-05-16: qty 2

## 2016-05-16 NOTE — ED Notes (Signed)
MD at bedside. 

## 2016-05-16 NOTE — ED Triage Notes (Signed)
Migraine since Wednesday. Facial pain. Light sensitive. Nausea.

## 2016-05-16 NOTE — ED Provider Notes (Signed)
[facility] Provider Note   CSN: [id number] Arrival date & time: 05/16/16  1306     History   Chief Complaint No chief complaint on file.   HPI Debra Cardenas is a 45 y.o. female.  The history is provided by the patient.  Migraine  This is a recurrent problem. Episode onset: 6 days. The problem occurs daily. The problem has not changed since onset.Pertinent negatives include no chest pain, no abdominal pain and no shortness of breath. Exacerbated by: a lot of talking. being on the computer. bending forward. light.  Nothing relieves the symptoms. Treatments tried: decongestants  The treatment provided no relief.   Also reports several days of right ear discharge. She reports the patient has a history of chronic ear infections that required tympanostomy for drainage.  Past Medical History:  Diagnosis Date  . Adjustment disorder with mixed anxiety and depressed mood   . Anxiety   . Bipolar 1 disorder (HCC)   . Chronic pain   . Depression   . Fibromyalgia   . Migraine   . Morbid obesity (HCC)   . MRSA (methicillin resistant staph aureus) culture positive   . Myalgia and myositis   . Opiate dependence [facility])     Patient Active Problem List   Diagnosis Date Noted  . 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 Generalized anxiety disorder 01/18/2012    Past Surgical History:  Procedure Laterality Date  . ABDOMINAL HYSTERECTOMY    . CESAREAN SECTION    . EAR CYST EXCISION     x3  . KNEE ARTHROPLASTY    . TUBAL LIGATION    . uterine ablasion      OB History    No data available       Home Medications    Prior to Admission medications   Medication Sig Start Date End Date Taking? Authorizing Provider  atenolol (TENORMIN) 25 MG tablet Take 25 mg by mouth daily.   Yes Historical Provider, MD  busPIRone (BUSPAR) 10 MG tablet 2  bid 02/10/16  Yes Archer Asa, MD  ibuprofen (ADVIL,MOTRIN) 600 MG tablet Take 1 tablet (600 mg total) by mouth every  6 (six) hours as needed. 11/06/14  Yes Junius Finner, PA-C  traZODone (DESYREL) 100 MG tablet 1 qhs  May repeat 02/10/16  Yes Archer Asa, MD  azithromycin (ZITHROMAX) 250 MG tablet Take 1 tablet (250 mg total) by mouth daily. Take first 2 tablets together, then 1 every day until finished. 05/17/16 05/21/16  Nira Conn, MD  fluticasone (FLONASE) 50 MCG/ACT nasal spray Place 2 sprays into both nostrils daily.    Historical Provider, MD  oxycodone (OXY-IR) 5 MG capsule Take 10 mg by mouth every 4 (four) hours as needed.    Historical Provider, MD  pregabalin (LYRICA) 225 MG capsule Take 225 mg by mouth 2 (two) times daily.    Historical Provider, MD  temazepam (RESTORIL) 15 MG capsule TAKE ONE CAPSULE BY MOUTH AT BEDTIME MAY  REPEAT  ONCE  -  TAKE  NO  MORE  THAN  2  CAPSULES  PER  DAY 05/06/15   Archer Asa, MD  traMADol (ULTRAM) 50 MG tablet Take 1 tablet (50 mg total) by mouth every 6 (six) hours as needed. 11/06/14   Junius Finner, PA-C    Family History No family history on file.  Social History Social History  Substance Use Topics  . Smoking status: Never Smoker  . Smokeless tobacco: Never Used  . Alcohol  use No     Allergies   Penicillins and Tylenol [acetaminophen]   Review of Systems Review of Systems  Constitutional: Negative for chills and fever.  HENT: Positive for congestion and rhinorrhea.   Eyes: Negative for visual disturbance.  Respiratory: Negative for cough and shortness of breath.   Cardiovascular: Negative for chest pain.  Gastrointestinal: Negative for abdominal pain, diarrhea, nausea and vomiting.  Genitourinary: Negative for difficulty urinating.  Musculoskeletal: Negative for gait problem.  Neurological: Positive for light-headedness.  All other systems reviewed and are negative.    Physical Exam Updated Vital Signs BP 138/92   Pulse 76   Temp 97.9 F (36.6 C) (Oral)   Resp 20   Ht 5\' 7"  (1.702 m)   Wt 289 lb (131.1 kg)   SpO2 99%    BMI 45.26 kg/m   Physical Exam  Constitutional: She is oriented to person, place, and time. She appears well-developed and well-nourished. No distress.  HENT:  Head: Normocephalic and atraumatic.  Right Ear: External ear normal. There is drainage. Tympanic membrane is perforated. A middle ear effusion (purulent) is present.  Left Ear: External ear normal. Tympanic membrane is not perforated. A middle ear effusion is present.  Nose: Mucosal edema present.  Frontal and maxillary sinus tenderness   Eyes: Conjunctivae and EOM are normal. Pupils are equal, round, and reactive to light. Right eye exhibits no discharge. Left eye exhibits no discharge. No scleral icterus.  Neck: Normal range of motion. Neck supple.  Cardiovascular: Normal rate, regular rhythm and normal heart sounds.  Exam reveals no gallop and no friction rub.   No murmur heard. Pulmonary/Chest: Effort normal and breath sounds normal. No stridor. No respiratory distress. She has no wheezes.  Abdominal: Soft. She exhibits no distension. There is no tenderness.  Musculoskeletal: She exhibits no edema or tenderness.  Neurological: She is alert and oriented to person, place, and time.  Mental Status: Alert and oriented to person, place, and time. Attention and concentration normal. Speech clear. Recent memory is intac  Cranial Nerves  II Visual Fields: Intact to confrontation. Visual fields intact. III, IV, VI: Pupils equal and reactive to light and near. Full eye movement without nystagmus  V Facial Sensation: Normal. No weakness of masticatory muscles  VII: No facial weakness or asymmetry  VIII Auditory Acuity: Grossly normal  IX/X: The uvula is midline; the palate elevates symmetrically  XI: Normal sternocleidomastoid and trapezius strength  XII: The tongue is midline. No atrophy or fasciculations.   Motor System: Muscle Strength: 5/5 and symmetric in the upper and lower extremities. No pronation or drift.  Muscle Tone: Tone  and muscle bulk are normal in the upper and lower extremities.   Reflexes: DTRs: 2+ and symmetrical in all four extremities. Plantar responses are flexor bilaterally.  Coordination: Intact finger-to-nose and rapid alternating movements. No tremor.  Sensation: Intact to light touch, and pinprick. Negative Romberg test.  Gait: Routine gait normal    Skin: Skin is warm and dry. No rash noted. She is not diaphoretic. No erythema.  Psychiatric: She has a normal mood and affect.     ED Treatments / Results  Labs (all labs ordered are listed, but only abnormal results are displayed) Labs Reviewed - No data to display  EKG  EKG Interpretation None       Radiology No results found.  Procedures Procedures (including critical care time)  Medications Ordered in ED Medications  diphenhydrAMINE (BENADRYL) injection 25 mg (25 mg Intravenous Given 05/16/16 1455)  dexamethasone (DECADRON) injection 10 mg (10 mg Intravenous Given 05/16/16 1455)  prochlorperazine (COMPAZINE) injection 10 mg (10 mg Intravenous Given 05/16/16 1455)  azithromycin (ZITHROMAX) tablet 500 mg (500 mg Oral Given 05/16/16 1545)     Initial Impression / Assessment and Plan / ED Course  I have reviewed the triage vital signs and the nursing notes.  Pertinent labs & imaging results that were available during my care of the patient were reviewed by me and considered in my medical decision making (see chart for details).  Clinical Course    Presentation was consistent with sinus headache given patient's congestion and rhinorrhea, as well as physical exam findings. Patient also with acute otitis media of the right ear, that is already draining from the tympanostomy. She denied any recent fevers, neck pain, nuchal rigidity, visual changes, nausea, vomiting. No suspicion for meningitis. No papilledema on funduscopic exam, doubt IIH. No recent trauma or blood thinners, doubt intracranial bleed. Not classic for subarachnoid  hemorrhage. Not typical for her migraines. However patient requested migraine L which had some improvement of her headache.  Patient also provided with first dose of azithromycin given her penicillin allergy. She'll be prescribed 4 additional days of azithromycin and instructed to follow up closely with her primary care provider for evaluation of resolution of her ear infection.  Final Clinical Impressions(s) / ED Diagnoses   Final diagnoses:  Sinus headache  Recurrent acute suppurative otitis media of right ear without spontaneous rupture of tympanic membrane   Disposition: Discharge  Condition: Good  I have discussed the results, Dx and Tx plan with the patient who expressed understanding and agree(s) with the plan. Discharge instructions discussed at great length. The patient was given strict return precautions who verbalized understanding of the instructions. No further questions at time of discharge.    Discharge Medication List as of 05/16/2016  3:33 PM    START taking these medications   Details  azithromycin (ZITHROMAX) 250 MG tablet Take 1 tablet (250 mg total) by mouth daily. Take first 2 tablets together, then 1 every day until finished., Starting Tue 05/17/2016, Until Sat 05/21/2016, Print        Follow Up: Patria ManeMike Gassemi, MD [address] [facility] [address] [phone]  In 3 days For close follow up to assess for treatment efficacy      Nira ConnPedro Eduardo Cardama, MD 05/16/16 1724

## 2016-08-17 ENCOUNTER — Ambulatory Visit (HOSPITAL_COMMUNITY): Payer: Self-pay | Admitting: Psychiatry

## 2016-10-07 ENCOUNTER — Ambulatory Visit (HOSPITAL_COMMUNITY): Payer: Self-pay | Admitting: Psychiatry

## 2016-10-11 ENCOUNTER — Encounter (HOSPITAL_COMMUNITY): Payer: Self-pay | Admitting: Psychiatry

## 2016-10-11 ENCOUNTER — Ambulatory Visit (INDEPENDENT_AMBULATORY_CARE_PROVIDER_SITE_OTHER): Payer: Self-pay | Admitting: Psychiatry

## 2016-10-11 DIAGNOSIS — F4323 Adjustment disorder with mixed anxiety and depressed mood: Secondary | ICD-10-CM

## 2016-10-11 DIAGNOSIS — Z79891 Long term (current) use of opiate analgesic: Secondary | ICD-10-CM

## 2016-10-11 DIAGNOSIS — Z9889 Other specified postprocedural states: Secondary | ICD-10-CM

## 2016-10-11 DIAGNOSIS — Z9851 Tubal ligation status: Secondary | ICD-10-CM

## 2016-10-11 DIAGNOSIS — Z79899 Other long term (current) drug therapy: Secondary | ICD-10-CM

## 2016-10-11 MED ORDER — BUSPIRONE HCL 10 MG PO TABS: ORAL_TABLET | ORAL | 7 refills | Status: DC

## 2016-10-11 MED ORDER — TRAZODONE HCL 100 MG PO TABS: ORAL_TABLET | ORAL | 6 refills | Status: DC
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
## 2017-02-23 ENCOUNTER — Other Ambulatory Visit (HOSPITAL_COMMUNITY): Payer: Self-pay | Admitting: Psychiatry

## 2017-04-05 ENCOUNTER — Ambulatory Visit (INDEPENDENT_AMBULATORY_CARE_PROVIDER_SITE_OTHER): Payer: Self-pay | Admitting: Psychiatry

## 2017-04-05 ENCOUNTER — Encounter (HOSPITAL_COMMUNITY): Payer: Self-pay | Admitting: Psychiatry

## 2017-04-05 DIAGNOSIS — F4323 Adjustment disorder with mixed anxiety and depressed mood: Secondary | ICD-10-CM

## 2017-04-05 MED ORDER — TRAZODONE HCL 100 MG PO TABS: ORAL_TABLET | ORAL | 6 refills | Status: DC

## 2017-04-05 MED ORDER — BUSPIRONE HCL 10 MG PO TABS: ORAL_TABLET | ORAL | 7 refills | Status: DC
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
## 2017-10-04 ENCOUNTER — Ambulatory Visit (INDEPENDENT_AMBULATORY_CARE_PROVIDER_SITE_OTHER): Payer: Self-pay | Admitting: Psychiatry

## 2017-10-04 ENCOUNTER — Encounter (HOSPITAL_COMMUNITY): Payer: Self-pay | Admitting: Psychiatry

## 2017-10-04 DIAGNOSIS — F4322 Adjustment disorder with anxiety: Secondary | ICD-10-CM

## 2017-10-04 DIAGNOSIS — F4323 Adjustment disorder with mixed anxiety and depressed mood: Secondary | ICD-10-CM

## 2017-10-04 DIAGNOSIS — Z79899 Other long term (current) drug therapy: Secondary | ICD-10-CM

## 2017-10-04 MED ORDER — TRAZODONE HCL 100 MG PO TABS: ORAL_TABLET | ORAL | 6 refills | Status: DC

## 2017-10-04 MED ORDER — BUSPIRONE HCL 10 MG PO TABS
ORAL_TABLET | ORAL | 7 refills | Status: DC
Start: 2017-10-04 — End: 2018-04-17

## 2017-10-04 NOTE — Progress Notes (Signed)
<5 Patient ID: Debra Cardenas, female   DOB: 02/24/1971, 47 y.o.   [id number] [facility]  10/04/2017 2:30 PM [facility]  MRN:  [id number] Subjective:  Good spirits Principal Problem: Adjustment disorder with anxious mood state Diagnosis:  Adjustment disorder with anxious mood state Patient is doing fairly well. She's sitting resolved her church. He doesn't really job. She says he has difficulty finding. The patient denies anxiety. Her depression is well-controlled. She lives with her daughter has Down syndrome and another daughter. Father is actually doing better. He was quite ill. Financially she is fairly stable. She takes BuSpar and trazodone does well. He does a lot of work for her church. She denies the use of alcohol or drugs. She is sleeping and eating very well. She's good energy. She has no problems thinking or concentrating. She enjoys music and enjoys life Gen. She's a good sense of worth. She's not suicidal. She's actually functioning fairly well. She's a good spirits. Today she brought with her child who she is sitting for who is in a stroller. Patient seems calm. She is interactive. She is pleasant is no complaints. Higher dose of trazodone made her sleepy so she wants to stay on 200 mg. Patient Active Problem List   Diagnosis Date Noted  . 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 Generalized anxiety disorder [F41.1] 01/18/2012   Total Time spent with patient: 15 minutes  Past Psychiatric History:   Past Medical History:  Past Medical History:  Diagnosis Date  . Adjustment disorder with mixed anxiety and depressed mood   . Anxiety   . Bipolar 1 disorder (HCC)   . Chronic pain   . Depression   . Fibromyalgia   . Migraine   . Morbid obesity (HCC)   . MRSA (methicillin resistant staph aureus) culture positive   . Myalgia and myositis   . Opiate dependence (HCC)     Past Surgical History:  Procedure Laterality Date  . ABDOMINAL  HYSTERECTOMY    . CESAREAN SECTION    . EAR CYST EXCISION     x3  . KNEE ARTHROPLASTY    . TUBAL LIGATION    . uterine ablasion     Family History: No family history on file. Family Psychiatric  History:  Social History:  Social History   Substance and Sexual Activity  Alcohol Use No     Social History   Substance and Sexual Activity  Drug Use No    Social History   Socioeconomic History  . Marital status: Married    Spouse name: None  . Number of children: None  . Years of education: None  . Highest education level: None  Social Needs  . Financial resource strain: None  . Food insecurity - worry: None  . Food insecurity - inability: None  . Transportation needs - medical: None  . Transportation needs - non-medical: None  Occupational History  . None  Tobacco Use  . Smoking status: Never Smoker  . Smokeless tobacco: Never Used  Substance and Sexual Activity  . Alcohol use: No  . Drug use: No  . Sexual activity: Not Currently    Birth control/protection: Surgical  Other Topics Concern  . None  Social History Narrative   ** Merged History Encounter **       Additional Social History:  Sleep: Good  Appetite:  Good  Current Medications: Current Outpatient Medications  Medication Sig Dispense Refill  . busPIRone (BUSPAR) 10 MG tablet 2  bid 120 tablet 7  . fluticasone (FLONASE) 50 MCG/ACT nasal spray Place 2 sprays into both nostrils daily.    Marland Kitchen ibuprofen (ADVIL,MOTRIN) 600 MG tablet Take 1 tablet (600 mg total) by mouth every 6 (six) hours as needed. 30 tablet 0  . oxycodone (OXY-IR) 5 MG capsule Take 10 mg by mouth every 4 (four) hours as needed.    . pregabalin (LYRICA) 150 MG capsule Take 150 mg by mouth 3 (three) times daily.    . traZODone (DESYREL) 100 MG tablet 2 qhs 60 tablet 6  . gabapentin (NEURONTIN) 300 MG capsule Take 300 mg by mouth 3 (three) times daily.     No current facility-administered medications  for this visit.     Lab Results: No results found for this or any previous visit (from the past 48 hour(s)).  Physical Findings: AIMS:  , ,  ,  ,    CIWA:    COWS:     Musculoskeletal: Strength & Muscle Tone: within normal limits Gait & Station: normal Patient leans: Right  Psychiatric Specialty Exam: ROS  Blood pressure 130/70, pulse 84, height 5\' 7"  (1.702 m), weight (!) 302 lb (137 kg).Body mass index is 47.3 kg/m.  General Appearance: Casual  Eye Contact::  Good  Speech:  Clear and Coherent  Volume:  Normal  Mood:  NA  Affect:  NA  Thought Process:  Coherent  Orientation:  Full (Time, Place, and Person)  Thought Content:  WDL  Suicidal Thoughts:  No  Homicidal Thoughts:  No  Memory:  Negative  Judgement:  Good  Insight:  Good  Psychomotor Activity:  Normal  Concentration:  Good  Recall:  Good  Fund of Knowledge:Good  Language: Good  Akathisia:  No  Handed:  Right  AIMS (if indicated):     Assets:  Desire for Improvement  ADL's:  Intact  Cognition: WNL  Sleep:      Treatment Plan Summary: Patient is doing well. She'll adjust down her trazodone is taking 200 mg. To continue taking BuSpar to twice a day. Patient is doing well. Her mood is stable. She denies chest pain or shortness of breath. She denies any neurological symptoms she is functioning fairly well. She will return to see me in 5 months

## 2018-03-07 ENCOUNTER — Ambulatory Visit (HOSPITAL_COMMUNITY): Payer: Self-pay | Admitting: Psychiatry

## 2018-04-17 ENCOUNTER — Ambulatory Visit (INDEPENDENT_AMBULATORY_CARE_PROVIDER_SITE_OTHER): Payer: Self-pay | Admitting: Psychiatry

## 2018-04-17 ENCOUNTER — Encounter (HOSPITAL_COMMUNITY): Payer: Self-pay | Admitting: Psychiatry

## 2018-04-17 DIAGNOSIS — F4323 Adjustment disorder with mixed anxiety and depressed mood: Secondary | ICD-10-CM

## 2018-04-17 MED ORDER — BUSPIRONE HCL 10 MG PO TABS: ORAL_TABLET | ORAL | 7 refills | Status: DC

## 2018-04-17 MED ORDER — TRAZODONE HCL 100 MG PO TABS: ORAL_TABLET | ORAL | 6 refills | Status: DC

## 2018-04-17 NOTE — Progress Notes (Signed)
<5 Patient ID: Debra Cardenas [facility], female   DOB: Nov 07, 1970, 47 y.o.   MRN: [id number] [facility] MD Progress Note  04/17/2018 2:38 PM [place]  MRN:  [id number] Subjective:  Good spirits Principal Problem: Adjustment disorder with anxious mood state Diagnosis:  Adjustment disorder with anxious mood state Today the patient is doing fairly well. She's taking care of her thoughts. She gets pain. The patient lives with HER-2 daughters who are ages 55 and 42. The patient makes wigs insult. The patient does fairly well. The patient's mood is good. She denies depression. She denies anhedonia. She denies significant anxiety. She has been seen in about 6 months. The patient drinks no alcohol uses no drugs. Presently she receives opiates for pain.problem change. The patient shared BuSpar) basis and takes trazodone. She says her sleep is worse since we reduced her trazodone from 300 mg down to 100 mg. The patient actually stays active. She loves 3 she walks spends a lot of time with the process. She goes to church regularly. She recently met a man that she has difficulty with trust. The patient is eating very well. She's having no problems thinking or concentrating. She has no psychosis. She denies being suicidal. She denies any chest pain shortness of breath or any neurological symptoms  Patient Active Problem List   Diagnosis Date Noted  . 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 Generalized anxiety disorder [F41.1] 01/18/2012   Total Time spent with patient: 15 minutes  Past Psychiatric History:   Past Medical History:  Past Medical History:  Diagnosis Date  . Adjustment disorder with mixed anxiety and depressed mood   . Anxiety   . Bipolar 1 disorder ([place])   . Chronic pain   . Depression   . Fibromyalgia   . Migraine   . Morbid obesity ([place])   . MRSA (methicillin resistant staph aureus) culture positive   . Myalgia and myositis   . Opiate dependence ([place])     Past Surgical  History:  Procedure Laterality Date  . ABDOMINAL HYSTERECTOMY    . CESAREAN SECTION    . EAR CYST EXCISION     x3  . KNEE ARTHROPLASTY    . TUBAL LIGATION    . uterine ablasion     Family History: History reviewed. No pertinent family history. Family Psychiatric  History:  Social History:  Social History   Substance and Sexual Activity  Alcohol Use No     Social History   Substance and Sexual Activity  Drug Use No    Social History   Socioeconomic History  . Marital status: Married    Spouse name: Not on file  . Number of children: Not on file  . Years of education: Not on file  . Highest education level: Not on file  Occupational History  . Not on file  Social Needs  . Financial resource strain: Not on file  . Food insecurity:    Worry: Not on file    Inability: Not on file  . Transportation needs:    Medical: Not on file    Non-medical: Not on file  Tobacco Use  . Smoking status: Never Smoker  . Smokeless tobacco: Never Used  Substance and Sexual Activity  . Alcohol use: No  . Drug use: No  . Sexual activity: Not Currently    Birth control/protection: Surgical  Lifestyle  . Physical activity:    Days per week: Not on file    Minutes per session: Not  on file  . Stress: Not on file  Relationships  . Social connections:    Talks on phone: Not on file    Gets together: Not on file    Attends religious service: Not on file    Active member of club or organization: Not on file    Attends meetings of clubs or organizations: Not on file    Relationship status: Not on file  Other Topics Concern  . Not on file  Social History Narrative   ** Merged History Encounter **       Additional Social History:                         Sleep: Good  Appetite:  Good  Current Medications: Current Outpatient Medications  Medication Sig Dispense Refill  . busPIRone (BUSPAR) 10 MG tablet 2  bid 120 tablet 7  . fluticasone (FLONASE) 50 MCG/ACT nasal spray  Place 2 sprays into both nostrils daily.    Marland Kitchen gabapentin (NEURONTIN) 300 MG capsule Take 300 mg by mouth 3 (three) times daily.    Marland Kitchen ibuprofen (ADVIL,MOTRIN) 600 MG tablet Take 1 tablet (600 mg total) by mouth every 6 (six) hours as needed. 30 tablet 0  . oxycodone (OXY-IR) 5 MG capsule Take 10 mg by mouth every 4 (four) hours as needed.    . pregabalin (LYRICA) 150 MG capsule Take 150 mg by mouth 3 (three) times daily.    . traZODone (DESYREL) 100 MG tablet 3  qhs 90 tablet 6   No current facility-administered medications for this visit.     Lab Results: No results found for this or any previous visit (from the past 48 hour(s)).  Physical Findings: AIMS:  , ,  ,  ,    CIWA:    COWS:     Musculoskeletal: Strength & Muscle Tone: within normal limits Gait & Station: normal Patient leans: Right  Psychiatric Specialty Exam: ROS  Blood pressure 138/74, pulse 80, height 5' 7"  (1.702 m), weight 294 lb (133.4 kg).Body mass index is 46.05 kg/m.  General Appearance: Casual  Eye Contact::  Good  Speech:  Clear and Coherent  Volume:  Normal  Mood:  NA  Affect:  NA  Thought Process:  Coherent  Orientation:  Full (Time, Place, and Person)  Thought Content:  WDL  Suicidal Thoughts:  No  Homicidal Thoughts:  No  Memory:  Negative  Judgement:  Good  Insight:  Good  Psychomotor Activity:  Normal  Concentration:  Good  Recall:  Good  Fund of Knowledge:Good  Language: Good  Akathisia:  No  Handed:  Right  AIMS (if indicated):     Assets:  Desire for Improvement  ADL's:  Intact  Cognition: WNL  Sleep:      Treatment Plan Summary: At this time the patient's problems are the just disorder with anxious mood state. At this time she does very well. Her second problem is insomnia. Will go ahead and raise her trazodone from 200 mg up to 300 mg. Today's a lot of time discussing her medications we spent 50% of the time coordinating and educating her about her medications. The patient is  actually doing well. She'll return to see me in 6 months. Note is the last time she was hospitalized was 13 years ago. 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

## 2018-10-17 ENCOUNTER — Ambulatory Visit (HOSPITAL_COMMUNITY): Payer: Self-pay | Admitting: Psychiatry

## 2019-05-09 ENCOUNTER — Other Ambulatory Visit: Payer: Self-pay

## 2019-05-09 ENCOUNTER — Ambulatory Visit (INDEPENDENT_AMBULATORY_CARE_PROVIDER_SITE_OTHER): Payer: Medicaid Other | Admitting: Psychiatry

## 2019-05-09 DIAGNOSIS — F4322 Adjustment disorder with anxiety: Secondary | ICD-10-CM

## 2019-05-09 DIAGNOSIS — G4700 Insomnia, unspecified: Secondary | ICD-10-CM

## 2019-05-09 DIAGNOSIS — F4323 Adjustment disorder with mixed anxiety and depressed mood: Secondary | ICD-10-CM

## 2019-05-09 MED ORDER — BUSPIRONE HCL 10 MG PO TABS: ORAL_TABLET | ORAL | 4 refills | Status: DC

## 2019-05-09 MED ORDER — TRAZODONE HCL 100 MG PO TABS: ORAL_TABLET | ORAL | 4 refills | Status: DC

## 2019-05-09 NOTE — Progress Notes (Signed)
<5 Patient ID: [facility]as [facility], [facility]   DOB: 03/09/1971, 48 y.o.   MRN: [id number] [facility]  05/09/2019 3:16 PM [facility]  MRN:  [id number] Subjective:  Good spirits Principal Problem: Adjustment disorder with anxious mood state  Today the patient is doing fairly well.  She has not been seen in over a year.  There are a lot of problems with travel strategic's being able to get here etc.  Mainly had to do with transportation.  Then it had to do with the virus.  She is very scared of this.  The patient is just in the last 1 to 2 months ran out of her trazodone and as result has not been sleeping well.  She feels depressed 1 or 2 days out of the week which is more than usual but certainly is not all that clinically meaningful.  The patient is eating well.  Her energy is good.  In general he is she is functioning well.  Her anxiety is fairly well controlled.  She has some old BuSpar that she is taking and went back to.  The patient is not psychotic.  She denies the use of drugs or alcohol.  She denies a cough shortness of breath or fever.  Medically she is actually fairly stable.  She still drives.  She still gets invited out but is very cautious about the virus.  It is clear she is anxious.  Patient Active Problem List   Diagnosis Date Noted  . 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 Generalized anxiety disorder [F41.1] 01/18/2012   Total Time spent with patient: 15 minutes  Past Psychiatric History:   Past Medical History:  Past Medical History:  Diagnosis Date  . Adjustment disorder with mixed anxiety and depressed mood   . Anxiety   . Bipolar 1 disorder (HCC)   . Chronic pain   . Depression   . Fibromyalgia   . Migraine   . Morbid obesity (HCC)   . MRSA (methicillin resistant staph aureus) culture positive   . Myalgia and myositis   . Opiate dependence (HCC)     Past Surgical History:  Procedure Laterality Date  . ABDOMINAL HYSTERECTOMY    .  CESAREAN SECTION    . EAR CYST EXCISION     x3  . KNEE ARTHROPLASTY    . TUBAL LIGATION    . uterine ablasion     Family History: No family history on file. Family Psychiatric  History:  Social History:  Social History   Substance and Sexual Activity  Alcohol Use No     Social History   Substance and Sexual Activity  Drug Use No    Social History   Socioeconomic History  . Marital status: Married    Spouse name: Not on file  . Number of children: Not on file  . Years of education: Not on file  . Highest education level: Not on file  Occupational History  . Not on file  Social Needs  . Financial resource strain: Not on file  . Food insecurity    Worry: Not on file    Inability: Not on file  . Transportation needs    Medical: Not on file    Non-medical: Not on file  Tobacco Use  . Smoking status: Never Smoker  . Smokeless tobacco: Never Used  Substance and Sexual Activity  . Alcohol use: No  . Drug use: No  . Sexual activity: Not Currently  Birth control/protection: Surgical  Lifestyle  . Physical activity    Days per week: Not on file    Minutes per session: Not on file  . Stress: Not on file  Relationships  . Social Musicianconnections    Talks on phone: Not on file    Gets together: Not on file    Attends religious service: Not on file    Active member of club or organization: Not on file    Attends meetings of clubs or organizations: Not on file    Relationship status: Not on file  Other Topics Concern  . Not on file  Social History Narrative   ** Merged History Encounter **       Additional Social History:                         Sleep: Good  Appetite:  Good  Current Medications: Current Outpatient Medications  Medication Sig Dispense Refill  . busPIRone (BUSPAR) 10 MG tablet 2  bid 120 tablet 4  . fluticasone (FLONASE) 50 MCG/ACT nasal spray Place 2 sprays into both nostrils daily.    Marland Kitchen. gabapentin (NEURONTIN) 300 MG capsule Take 300  mg by mouth 3 (three) times daily.    Marland Kitchen. ibuprofen (ADVIL,MOTRIN) 600 MG tablet Take 1 tablet (600 mg total) by mouth every 6 (six) hours as needed. 30 tablet 0  . oxycodone (OXY-IR) 5 MG capsule Take 10 mg by mouth every 4 (four) hours as needed.    . pregabalin (LYRICA) 150 MG capsule Take 150 mg by mouth 3 (three) times daily.    . traZODone (DESYREL) 100 MG tablet 3  qhs 90 tablet 4   No current facility-administered medications for this visit.     Lab Results: No results found for this or any previous visit (from the past 48 hour(s)).  Physical Findings: AIMS:  , ,  ,  ,    CIWA:    COWS:     Musculoskeletal: Strength & Muscle Tone: within normal limits Gait & Station: normal Patient leans: Right  Psychiatric Specialty Exam: ROS  There were no vitals taken for this visit.There is no height or weight on file to calculate BMI.  General Appearance: Casual  Eye Contact::  Good  Speech:  Clear and Coherent  Volume:  Normal  Mood:  NA  Affect:  NA  Thought Process:  Coherent  Orientation:  Full (Time, Place, and Person)  Thought Content:  WDL  Suicidal Thoughts:  No  Homicidal Thoughts:  No  Memory:  Negative  Judgement:  Good  Insight:  Good  Psychomotor Activity:  Normal  Concentration:  Good  Recall:  Good  Fund of Knowledge:Good  Language: Good  Akathisia:  No  Handed:  Right  AIMS (if indicated):     Assets:  Desire for Improvement  ADL's:  Intact  Cognition: WNL  Sleep:      Treatment Plan Summary: This patient has 2 problems.  The first is that of an adjustment disorder with an anxious mood state.  For this we will continue her BuSpar 10 mg 2 in the morning and 2 at night.  Her second problem is that of insomnia.  She does well taking 300 mg of trazodone.  The patient is interested in returning for more complete evaluation.  This was just a 10 to 15-minute visit.  Overall it is evident that she is functioning fairly well.  She certainly is not suicidal nor she  is psychotic.  She will return to see me in approximately 2 months.

## 2019-07-26 ENCOUNTER — Ambulatory Visit (INDEPENDENT_AMBULATORY_CARE_PROVIDER_SITE_OTHER): Payer: Medicaid Other | Admitting: Psychiatry

## 2019-07-26 ENCOUNTER — Other Ambulatory Visit: Payer: Self-pay

## 2019-07-26 DIAGNOSIS — F4323 Adjustment disorder with mixed anxiety and depressed mood: Secondary | ICD-10-CM

## 2019-07-26 DIAGNOSIS — F4322 Adjustment disorder with anxiety: Secondary | ICD-10-CM

## 2019-07-26 MED ORDER — TRAZODONE HCL 100 MG PO TABS: ORAL_TABLET | ORAL | 4 refills | Status: DC

## 2019-07-26 MED ORDER — BUSPIRONE HCL 10 MG PO TABS: ORAL_TABLET | ORAL | 4 refills | Status: DC

## 2019-07-26 NOTE — Progress Notes (Signed)
<5 Patient ID: Debra Cardenas [facility], female   DOB: Jan 28, 1971, 48 y.o.   MRN: [id number] [facility] MD Progress Note  07/26/2019 10:34 AM Rayn Shorb [facility]  MRN:  [id number] Subjective:  Good spirits Principal Problem: Adjustment disorder with anxious mood state  Today the patient is doing well.  She seems to be tolerating the pandemic just a little bit better.  She is anxious about the presidential election.  The patient is happy living with her 2 adult daughters.  They seem to be doing fairly well.  The patient is sleeping and eating well.  She is got good energy.  Her anxiety level seems to be well-controlled for now.  She has problems sleeping at times but it does not cause any daytime dysfunction.  Patient is no evidence of psychosis.  She denies any use of alcohol or drugs.  She presently is not in psychotherapy.  She seems to be functioning very well at this time.  She is got good energy and not to do what she needs to do.  The patient denies chest pain shortness of breath or any neurological symptoms at this time.  She is positive and optimistic.  She seems to be better adjusted.   Patient Active Problem List   Diagnosis Date Noted  . 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 Generalized anxiety disorder [F41.1] 01/18/2012   Total Time spent with patient: 15 minutes  Past Psychiatric History:   Past Medical History:  Past Medical History:  Diagnosis Date  . Adjustment disorder with mixed anxiety and depressed mood   . Anxiety   . Bipolar 1 disorder (HCC)   . Chronic pain   . Depression   . Fibromyalgia   . Migraine   . Morbid obesity (HCC)   . MRSA (methicillin resistant staph aureus) culture positive   . Myalgia and myositis   . Opiate dependence (HCC)     Past Surgical History:  Procedure Laterality Date  . ABDOMINAL HYSTERECTOMY    . CESAREAN SECTION    . EAR CYST EXCISION     x3  . KNEE ARTHROPLASTY    . TUBAL LIGATION    . uterine ablasion     Family History:  No family history on file. Family Psychiatric  History:  Social History:  Social History   Substance and Sexual Activity  Alcohol Use No     Social History   Substance and Sexual Activity  Drug Use No    Social History   Socioeconomic History  . Marital status: Married    Spouse name: Not on file  . Number of children: Not on file  . Years of education: Not on file  . Highest education level: Not on file  Occupational History  . Not on file  Social Needs  . Financial resource strain: Not on file  . Food insecurity    Worry: Not on file    Inability: Not on file  . Transportation needs    Medical: Not on file    Non-medical: Not on file  Tobacco Use  . Smoking status: Never Smoker  . Smokeless tobacco: Never Used  Substance and Sexual Activity  . Alcohol use: No  . Drug use: No  . Sexual activity: Not Currently    Birth control/protection: Surgical  Lifestyle  . Physical activity    Days per week: Not on file    Minutes per session: Not on file  . Stress: Not on file  Relationships  . Social  connections    Talks on phone: Not on file    Gets together: Not on file    Attends religious service: Not on file    Active member of club or organization: Not on file    Attends meetings of clubs or organizations: Not on file    Relationship status: Not on file  Other Topics Concern  . Not on file  Social History Narrative   ** Merged History Encounter **       Additional Social History:                         Sleep: Good  Appetite:  Good  Current Medications: Current Outpatient Medications  Medication Sig Dispense Refill  . busPIRone (BUSPAR) 10 MG tablet 2  bid 120 tablet 4  . fluticasone (FLONASE) 50 MCG/ACT nasal spray Place 2 sprays into both nostrils daily.    Marland Kitchen gabapentin (NEURONTIN) 300 MG capsule Take 300 mg by mouth 3 (three) times daily.    Marland Kitchen ibuprofen (ADVIL,MOTRIN) 600 MG tablet Take 1 tablet (600 mg total) by mouth every 6 (six)  hours as needed. 30 tablet 0  . oxycodone (OXY-IR) 5 MG capsule Take 10 mg by mouth every 4 (four) hours as needed.    . pregabalin (LYRICA) 150 MG capsule Take 150 mg by mouth 3 (three) times daily.    . traZODone (DESYREL) 100 MG tablet 3  qhs 90 tablet 4   No current facility-administered medications for this visit.     Lab Results: No results found for this or any previous visit (from the past 48 hour(s)).  Physical Findings: AIMS:  , ,  ,  ,    CIWA:    COWS:     Musculoskeletal: Strength & Muscle Tone: within normal limits Gait & Station: normal Patient leans: Right  Psychiatric Specialty Exam: ROS  There were no vitals taken for this visit.There is no height or weight on file to calculate BMI.  General Appearance: Casual  Eye Contact::  Good  Speech:  Clear and Coherent  Volume:  Normal  Mood:  NA  Affect:  NA  Thought Process:  Coherent  Orientation:  Full (Time, Place, and Person)  Thought Content:  WDL  Suicidal Thoughts:  No  Homicidal Thoughts:  No  Memory:  Negative  Judgement:  Good  Insight:  Good  Psychomotor Activity:  Normal  Concentration:  Good  Recall:  Good  Fund of Knowledge:Good  Language: Good  Akathisia:  No  Handed:  Right  AIMS (if indicated):     Assets:  Desire for Improvement  ADL's:  Intact  Cognition: WNL  Sleep:      Treatment Plan Summary: At this time the patient is to problems.  Her first 1 is that of anxiety.  This is related to her diagnosis of an adjustment disorder with an anxious mood state.  The BuSpar 10 mg 2 in the morning and 2 at night seems to be helpful.  We will continue on this dose.  Her second problem is that of insomnia.  When she takes trazodone 300 mg it works well.  She does get a good night of sleep most nights.  She is not taking naps.  She seems to be positive and optimistic.  She will be given a return appointment to see me hopefully in 3-1/2 months.

## 2019-08-14 ENCOUNTER — Ambulatory Visit (HOSPITAL_COMMUNITY): Payer: Medicaid Other | Admitting: Psychiatry

## 2019-10-25 ENCOUNTER — Ambulatory Visit (HOSPITAL_COMMUNITY): Payer: Medicaid Other | Admitting: Psychiatry

## 2019-10-25 ENCOUNTER — Other Ambulatory Visit: Payer: Self-pay

## 2020-04-06 ENCOUNTER — Emergency Department (HOSPITAL_BASED_OUTPATIENT_CLINIC_OR_DEPARTMENT_OTHER)
Admission: EM | Admit: 2020-04-06 | Discharge: 2020-04-06 | Disposition: A | Discharge status: Home / Self Care | Payer: No Typology Code available for payment source | Attending: Emergency Medicine | Admitting: Emergency Medicine

## 2020-04-06 ENCOUNTER — Emergency Department (HOSPITAL_BASED_OUTPATIENT_CLINIC_OR_DEPARTMENT_OTHER): Payer: No Typology Code available for payment source

## 2020-04-06 ENCOUNTER — Other Ambulatory Visit: Payer: Self-pay

## 2020-04-06 ENCOUNTER — Encounter (HOSPITAL_BASED_OUTPATIENT_CLINIC_OR_DEPARTMENT_OTHER): Payer: Self-pay | Admitting: *Deleted

## 2020-04-06 DIAGNOSIS — R079 Chest pain, unspecified: Secondary | ICD-10-CM | POA: Insufficient documentation

## 2020-04-06 DIAGNOSIS — M791 Myalgia, unspecified site: Secondary | ICD-10-CM | POA: Insufficient documentation

## 2020-04-06 MED ORDER — CYCLOBENZAPRINE HCL 10 MG PO TABS: 10.0000 mg | ORAL_TABLET | Freq: Two times a day (BID) | ORAL | 0 refills | Status: AC | PRN

## 2020-04-06 MED ORDER — NAPROXEN 375 MG PO TABS: 375.0000 mg | ORAL_TABLET | Freq: Two times a day (BID) | ORAL | 0 refills | Status: AC
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
## 2020-04-06 NOTE — ED Notes (Signed)
No chest discoloration noted
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
## 2020-04-06 NOTE — ED Notes (Signed)
AVS reviewed with pt, discussed safety while taking muscle relaxants, also importance taking NSAID prescribed as well, Copy of AVS provided, opportunity for questions provided as well
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
## 2020-06-01 ENCOUNTER — Emergency Department (HOSPITAL_COMMUNITY)
Admission: EM | Admit: 2020-06-01 | Discharge: 2020-06-01 | Disposition: A | Discharge status: Home / Self Care | Payer: HRSA Program | Attending: Emergency Medicine | Admitting: Emergency Medicine

## 2020-06-01 ENCOUNTER — Encounter (HOSPITAL_COMMUNITY): Payer: Self-pay | Admitting: Emergency Medicine

## 2020-06-01 ENCOUNTER — Emergency Department (HOSPITAL_COMMUNITY): Payer: HRSA Program

## 2020-06-01 DIAGNOSIS — Z96659 Presence of unspecified artificial knee joint: Secondary | ICD-10-CM | POA: Insufficient documentation

## 2020-06-01 DIAGNOSIS — U071 COVID-19: Secondary | ICD-10-CM | POA: Diagnosis not present

## 2020-06-01 DIAGNOSIS — R05 Cough: Secondary | ICD-10-CM | POA: Diagnosis present

## 2020-06-01 DIAGNOSIS — Z79899 Other long term (current) drug therapy: Secondary | ICD-10-CM | POA: Insufficient documentation

## 2020-06-01 LAB — CBC WITH DIFFERENTIAL/PLATELET
Abs Immature Granulocytes: 0.02 10*3/uL (ref 0.00–0.07)
Basophils Absolute: 0 10*3/uL (ref 0.0–0.1)
Basophils Relative: 0 %
Eosinophils Absolute: 0 10*3/uL (ref 0.0–0.5)
Eosinophils Relative: 0 %
HCT: 40.6 % (ref 36.0–46.0)
Hemoglobin: 13.1 g/dL (ref 12.0–15.0)
Immature Granulocytes: 0 %
Lymphocytes Relative: 12 %
Lymphs Abs: 0.9 10*3/uL (ref 0.7–4.0)
MCH: 26.6 pg (ref 26.0–34.0)
MCHC: 32.3 g/dL (ref 30.0–36.0)
MCV: 82.4 fL (ref 80.0–100.0)
Monocytes Absolute: 0.4 10*3/uL (ref 0.1–1.0)
Monocytes Relative: 5 %
Neutro Abs: 5.8 10*3/uL (ref 1.7–7.7)
Neutrophils Relative %: 83 %
Platelets: 307 10*3/uL (ref 150–400)
RBC: 4.93 MIL/uL (ref 3.87–5.11)
RDW: 16 % — ABNORMAL HIGH (ref 11.5–15.5)
WBC: 7 10*3/uL (ref 4.0–10.5)
nRBC: 0 % (ref 0.0–0.2)

## 2020-06-01 LAB — BASIC METABOLIC PANEL
Anion gap: 12 (ref 5–15)
BUN: 15 mg/dL (ref 6–20)
CO2: 23 mmol/L (ref 22–32)
Calcium: 9.1 mg/dL (ref 8.9–10.3)
Chloride: 102 mmol/L (ref 98–111)
Creatinine, Ser: 0.73 mg/dL (ref 0.44–1.00)
GFR calc Af Amer: >60 mL/min (ref >60)
GFR calc non Af Amer: >60 mL/min (ref >60)
Glucose, Bld: 103 mg/dL — ABNORMAL HIGH (ref 70–99)
Potassium: 4.5 mmol/L (ref 3.5–5.1)
Sodium: 137 mmol/L (ref 135–145)

## 2020-06-01 LAB — SARS CORONAVIRUS 2 BY RT PCR (HOSPITAL ORDER, PERFORMED IN ~~LOC~~ HOSPITAL LAB): SARS Coronavirus 2: POSITIVE — AB

## 2020-06-01 MED ORDER — BENZONATATE 100 MG PO CAPS: 100.0000 mg | ORAL_CAPSULE | Freq: Three times a day (TID) | ORAL | 0 refills | Status: AC

## 2020-06-01 MED ORDER — IPRATROPIUM BROMIDE HFA 17 MCG/ACT IN AERS
2.0000 | INHALATION_SPRAY | Freq: Once | RESPIRATORY_TRACT | Status: AC
  Administered 2020-06-01: 2 via RESPIRATORY_TRACT
  Filled 2020-06-01: qty 12.9

## 2020-06-01 MED ORDER — SODIUM CHLORIDE 0.9 % IV BOLUS
1000.0000 mL | Freq: Once | INTRAVENOUS | Status: AC
  Administered 2020-06-01: 1000 mL via INTRAVENOUS

## 2020-06-01 MED ORDER — OXYCODONE HCL 5 MG PO TABS
5.0000 mg | ORAL_TABLET | Freq: Once | ORAL | Status: AC
  Administered 2020-06-01: 5 mg via ORAL
  Filled 2020-06-01: qty 1

## 2020-06-01 NOTE — ED Provider Notes (Signed)
[facility]-EMERGENCY DEPT Provider Note   CSN: [id number] Arrival date & time: 06/01/20  1106     History Chief Complaint  Patient presents with  . Cough  . Fever  . Weakness    Peter Miniumoni Wells-Washington is a 49 y.o. female with PMHx anxiety, bipolar disorder, fibromyalgia who presents to the ED today with complaint of gradual onset, constant, dry cough for the past 1 week. Pt also complains of persistent watery diarrhea, body aches, headache, chills, subjective fever, loss of taste and smell, and nausea. She is unvaccinated against COVID 19. Pt reports she was not getting any better at home prompting her to come to the ED today. She is having chest pains after deep coughing however none at rest. No shortness of breath. No other complaints at this time.   The history is provided by the patient and medical records.       Past Medical History:  Diagnosis Date  . Adjustment disorder with mixed anxiety and depressed mood   . Anxiety   . Bipolar 1 disorder (HCC)   . Chronic pain   . Depression   . Fibromyalgia   . Migraine   . Morbid obesity (HCC)   . MRSA (methicillin resistant staph aureus) culture positive   . Myalgia and myositis   . Opiate dependence [facility](HCC)     Patient Active Problem List   Diagnosis Date Noted  . 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 Generalized anxiety disorder 01/18/2012    Past Surgical History:  Procedure Laterality Date  . ABDOMINAL HYSTERECTOMY    . CESAREAN SECTION    . EAR CYST EXCISION     x3  . KNEE ARTHROPLASTY    . TUBAL LIGATION    . uterine ablasion       OB History   No obstetric history on file.     No family history on file.  Social History   Tobacco Use  . Smoking status: Never Smoker  . Smokeless tobacco: Never Used  Vaping Use  . Vaping Use: Never used  Substance Use Topics  . Alcohol use: No  . Drug use: No    Home Medications Prior to Admission medications   Medication Sig  Start Date End Date Taking? Authorizing Provider  benzonatate (TESSALON) 100 MG capsule Take 1 capsule (100 mg total) by mouth every 8 (eight) hours. 06/01/20   Tanda RockersVenter, Ervie Mccard, PA-C  busPIRone (BUSPAR) 10 MG tablet 2  bid 07/26/19   Plovsky, Earvin HansenGerald, MD  cyclobenzaprine (FLEXERIL) 10 MG tablet Take 1 tablet (10 mg total) by mouth 2 (two) times daily as needed for muscle spasms. 04/06/20   Farrel GordonPatel, Shalyn, PA-C  fluticasone (FLONASE) 50 MCG/ACT nasal spray Place 2 sprays into both nostrils daily.    [provider]  gabapentin (NEURONTIN) 300 MG capsule Take 300 mg by mouth 3 (three) times daily.    [provider]  ibuprofen (ADVIL,MOTRIN) 600 MG tablet Take 1 tablet (600 mg total) by mouth every 6 (six) hours as needed. 11/06/14   Lurene ShadowPhelps, Erin O, PA-C  naproxen (NAPROSYN) 375 MG tablet Take 1 tablet (375 mg total) by mouth 2 (two) times daily. 04/06/20   Farrel GordonPatel, Shalyn, PA-C  oxycodone (OXY-IR) 5 MG capsule Take 10 mg by mouth every 4 (four) hours as needed.    [provider]  pregabalin (LYRICA) 150 MG capsule Take 150 mg by mouth 3 (three) times daily.    [provider]  traZODone (DESYREL) 100 MG tablet  3  qhs 07/26/19   Archer Asa, MD    Allergies    Penicillins and Tylenol [acetaminophen]  Review of Systems   Review of Systems  Constitutional: Positive for chills, fatigue and fever.  Respiratory: Positive for cough. Negative for shortness of breath.   Gastrointestinal: Positive for diarrhea and nausea. Negative for vomiting.  Musculoskeletal: Positive for myalgias.  Neurological: Positive for headaches.    Physical Exam Updated Vital Signs BP (!) 155/86 (BP Location: Left Arm)   Pulse 87   Temp 98.9 F (37.2 C) (Oral)   Resp 20   Ht 5\' 7"  (1.702 m)   Wt 133.4 kg   SpO2 96%   BMI 46.05 kg/m   Physical Exam Vitals and nursing note reviewed.  Constitutional:      Appearance: She is obese. She is not ill-appearing or diaphoretic.  HENT:      Head: Normocephalic and atraumatic.     Mouth/Throat:     Mouth: Mucous membranes are dry.  Eyes:     Conjunctiva/sclera: Conjunctivae normal.  Cardiovascular:     Rate and Rhythm: Normal rate and regular rhythm.     Pulses: Normal pulses.  Pulmonary:     Effort: Pulmonary effort is normal.     Breath sounds: Normal breath sounds. No wheezing, rhonchi or rales.     Comments: Speaking in full sentences without difficulty. No active coughing in the room. LCTAB. Satting 96% on RA at rest.  Abdominal:     Palpations: Abdomen is soft.     Tenderness: There is no abdominal tenderness. There is no guarding or rebound.  Musculoskeletal:     Cervical back: Neck supple.     Right lower leg: No edema.     Left lower leg: No edema.  Skin:    General: Skin is warm and dry.  Neurological:     Mental Status: She is alert.     ED Results / Procedures / Treatments   Labs (all labs ordered are listed, but only abnormal results are displayed) Labs Reviewed  SARS CORONAVIRUS 2 BY RT PCR (HOSPITAL ORDER, PERFORMED IN [facility]) - Abnormal; Notable for the following components:      Result Value   SARS Coronavirus 2 POSITIVE (*)    All other components within normal limits  BASIC METABOLIC PANEL - Abnormal; Notable for the following components:   Glucose, Bld 103 (*)    All other components within normal limits  CBC WITH DIFFERENTIAL/PLATELET - Abnormal; Notable for the following components:   RDW 16.0 (*)    All other components within normal limits    EKG None  Radiology DG Chest Port 1 View  Result Date: 06/01/2020 CLINICAL DATA:  Cough and fever EXAM: PORTABLE CHEST 1 VIEW COMPARISON:  April 06, 2020 FINDINGS: Lungs are clear. Heart size and pulmonary vascularity are normal. No adenopathy. No bone lesions. IMPRESSION: Lungs clear.  Cardiac silhouette within normal limits. Electronically Signed   By: April 08, 2020 III M.D.   On: 06/01/2020 12:54     Procedures Procedures (including critical care time)  Medications Ordered in ED Medications  ipratropium (ATROVENT HFA) inhaler 2 puff (has no administration in time range)  sodium chloride 0.9 % bolus 1,000 mL (1,000 mLs Intravenous New Bag/Given (Non-Interop) 06/01/20 1234)  oxyCODONE (Oxy IR/ROXICODONE) immediate release tablet 5 mg (5 mg Oral Given 06/01/20 1244)    ED Course  I have reviewed the triage vital signs and the nursing notes.  Pertinent labs &  imaging results that were available during my care of the patient were reviewed by me and considered in my medical decision making (see chart for details).  Clinical Course as of Jun 01 1436  Mon Jun 01, 2020  1412 SARS Coronavirus 2(!): POSITIVE [MV]    Clinical Course User Index [MV] Tanda Rockers, PA-C   MDM Rules/Calculators/A&P                          49 year old female who is unvaccinated against COVID-19 who presents to the ED today with Covid-like symptoms for the past week including cough, headache, body aches, nausea, loss of taste and smell, diarrhea, chills, subjective fevers.  Arrival to the ED patient is afebrile, nontachycardic nontachypneic.  She appears to be in no acute distress.  She is able to speak in full sentences without difficulty and satting 96% on room air.  She does appear to have dry mucous membranes however.  We will plan to swab for Covid at this time.  We will plan for EKG as patient states she is having some chest pain however she states this is mostly after coughing and not at rest, doubt ACS at this time.  We will plan for chest x-ray.  We will plan for labs and IV fluids.  Have discussed monoclonal antibodies with patient however she would like some time to think about it.  She is currently on day 7 of symptoms.  She does qualify given BMI of 46.05.   CBC without leukocytosis. Hgb stable at 13.1 BMP with glucose 103. No other electrolyte abnormalities.  CXR clear  COVID test has returned  POSITIVE. Pt has been ambulated without desaturations. She is stable for discharge at this time. I have updated her on her covid positive status. She still wants to think about MAB at this time. Will plan to discharge home at this time with albuterol inhaler and tessalon perles. Pt instructed to self isolate for 14 days starting today and to return for any worsening symptoms. She is instructed to increase her oral intake as well to stay hydrated. She is in agreement with plan and stable for discharge home.   This note was prepared using Dragon voice recognition software and may include unintentional dictation errors due to the inherent limitations of voice recognition software.  Amrie Gurganus was evaluated in Emergency Department on 06/01/2020 for the symptoms described in the history of present illness. She was evaluated in the context of the global COVID-19 pandemic, which necessitated consideration that the patient might be at risk for infection with the SARS-CoV-2 virus that causes COVID-19. Institutional protocols and algorithms that pertain to the evaluation of patients at risk for COVID-19 are in a state of rapid change based on information released by regulatory bodies including the CDC and federal and state organizations. These policies and algorithms were followed during the patient's care in the ED.  Final Clinical Impression(s) / ED Diagnoses Final diagnoses:  COVID-19    Rx / DC Orders ED Discharge Orders         Ordered    benzonatate (TESSALON) 100 MG capsule  Every 8 hours        06/01/20 1433           Discharge Instructions     You have tested POSITIVE for COVID 19 today. Please stay home and self isolate for 14 days starting today. Cleared: 09/28.   Use the albuterol inhaler as needed for shortness of  breath. I have prescribed cough medication for you to take as well. Drink plenty of fluids to stay hydrated.   I would recommend buying a pulse oximeter (can be  bought off of [employer]) to check your oxygen levels. If it is persistently below 88% at rest you will need to come back to the ED IMMEDIATELY for further evaluation.   Please contact the Infusion Center at [phone] if you are interested in receiving the monoclonal antibody treatment. They will be able to answer any and all questions you may have.   Return to the ED IMMEDIATELY for any worsening symptoms including worsening shortness of breath, severe chest pain, lips/fingers turning blue, passing out, or any other associated symptoms.        Tanda Rockers, PA-C 06/01/20 1437    Benjiman Core, MD 06/01/20 [phone]

## 2020-06-01 NOTE — Discharge Instructions (Addendum)
You have tested POSITIVE for COVID 19 today. Please stay home and self isolate for 14 days starting today. Cleared: 09/28.   Use the albuterol inhaler as needed for shortness of breath. I have prescribed cough medication for you to take as well. Drink plenty of fluids to stay hydrated.   I would recommend buying a pulse oximeter (can be bought off of [employer]) to check your oxygen levels. If it is persistently below 88% at rest you will need to come back to the ED IMMEDIATELY for further evaluation.   Please contact the Infusion Center at [phone] if you are interested in receiving the monoclonal antibody treatment. They will be able to answer any and all questions you may have.   Return to the ED IMMEDIATELY for any worsening symptoms including worsening shortness of breath, severe chest pain, lips/fingers turning blue, passing out, or any other associated symptoms.

## 2020-06-01 NOTE — ED Triage Notes (Signed)
EMS reports, patient from home, with cough, fever, weakness x1 week. Loss of smell x3 days. Reports sharp chest pain on inhalation. Pt reports taking Mucinex with tylenol today.   EMS gave Aspirin 324mg  96% on RA, 96.8 temp, 20 RR, 130/83, CBG 94, 35 EtCO2

## 2020-06-02 ENCOUNTER — Telehealth: Payer: Self-pay | Admitting: Critical Care Medicine

## 2020-06-02 NOTE — Telephone Encounter (Signed)
Called to Discuss with patient about Covid symptoms and the use of the monoclonal antibody infusion for those with mild to moderate Covid symptoms and at a high risk of hospitalization.     Pt is qualified for this infusion due to co-morbid conditions and/or a member of an at-risk group.     Patient Active Problem List   Diagnosis Date Noted  . 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 Generalized anxiety disorder 01/18/2012    Patient declines infusion at this time. Symptoms tier reviewed as well as criteria for ending isolation. Preventative practices reviewed. Patient verbalized understanding.    Patient advised to call back if he/she decides that he/she does want to get infusion. Callback number to the infusion center given. Patient advised to go to Urgent care or ED with severe symptoms.

## 2020-06-18 ENCOUNTER — Emergency Department (HOSPITAL_BASED_OUTPATIENT_CLINIC_OR_DEPARTMENT_OTHER): Payer: Self-pay

## 2020-06-18 ENCOUNTER — Other Ambulatory Visit: Payer: Self-pay

## 2020-06-18 ENCOUNTER — Emergency Department (HOSPITAL_BASED_OUTPATIENT_CLINIC_OR_DEPARTMENT_OTHER)
Admission: EM | Admit: 2020-06-18 | Discharge: 2020-06-18 | Disposition: A | Discharge status: Home / Self Care | Payer: Self-pay | Attending: Emergency Medicine | Admitting: Emergency Medicine

## 2020-06-18 ENCOUNTER — Encounter (HOSPITAL_BASED_OUTPATIENT_CLINIC_OR_DEPARTMENT_OTHER): Payer: Self-pay | Admitting: *Deleted

## 2020-06-18 DIAGNOSIS — M545 Low back pain: Secondary | ICD-10-CM | POA: Insufficient documentation

## 2020-06-18 DIAGNOSIS — Z8616 Personal history of COVID-19: Secondary | ICD-10-CM | POA: Insufficient documentation

## 2020-06-18 DIAGNOSIS — M5441 Lumbago with sciatica, right side: Secondary | ICD-10-CM

## 2020-06-18 DIAGNOSIS — Z96659 Presence of unspecified artificial knee joint: Secondary | ICD-10-CM | POA: Insufficient documentation

## 2020-06-18 DIAGNOSIS — G8929 Other chronic pain: Secondary | ICD-10-CM | POA: Insufficient documentation

## 2020-06-18 MED ORDER — METHYLPREDNISOLONE 4 MG PO TBPK: ORAL_TABLET | ORAL | 0 refills | Status: AC

## 2020-06-18 MED ORDER — DEXAMETHASONE SODIUM PHOSPHATE 10 MG/ML IJ SOLN
10.0000 mg | Freq: Once | INTRAMUSCULAR | Status: AC
  Administered 2020-06-18: 10 mg via INTRAMUSCULAR
  Filled 2020-06-18: qty 1

## 2020-06-18 MED ORDER — KETOROLAC TROMETHAMINE 60 MG/2ML IM SOLN
60.0000 mg | Freq: Once | INTRAMUSCULAR | Status: AC
  Administered 2020-06-18: 60 mg via INTRAMUSCULAR
  Filled 2020-06-18: qty 2

## 2020-06-18 NOTE — ED Triage Notes (Addendum)
Chronic back pain. She has been going to a chiropractor and has increased pain since having a readjustment 2 days ago.

## 2020-06-18 NOTE — ED Notes (Signed)
Pt. Having lower back pain and L leg pain with inability to walk she reports.  Pt. Is able to move the L leg and L foot.  Pt. Reports pain with palpation with EDP .  Pt. Is able to feel touch and sensation.  Pt. Having no trouble with BM or urination.

## 2020-06-18 NOTE — Discharge Instructions (Addendum)
Thank you for letting us take care of you in the ER today  Your x-rays today were reassuring but did not show any new fractures.  Please take the steroid pack until finished for pain.  Continue to take your muscle relaxer and pain medicine as prescribed. I have also provided some low back stretches. You may also follow-up with your  pain specialist.  I have also provided the contact information for a neurosurgery specialist here in the area, please call the phone number and follow-up with them as well.  Return to the ER for any new or worsening symptoms.

## 2020-06-18 NOTE — ED Provider Notes (Signed)
[facility] EMERGENCY DEPARTMENT Provider Note   CSN: [id number] Arrival date & time: 06/18/20  1110     History Chief Complaint  Patient presents with   Back Pain    Debra Cardenas is a 49 y.o. female.  HPI 49 year old female with a history of adjustment disorder, anxiety, bipolar 1, chronic back pain, fibromyalgia, migraines, obesity, opioid dependence presents to the ER with complaints of low back pain.  Patient states that she was diagnosed with Covid several weeks ago and has been feeling weak and not following up with her chiropractor.  She states on Tuesday she made a follow-up appointment and had a readjustment.  She felt a sharp pain in her mid back while being readjusted, and the chiropractor told her to apply ice.  She states that she has been applying ice every 20 minutes over the last 24 hours.  States that she was standing up by the kitchen and made a turn this morning, and felt a sudden onset of back pain.  States that she has pain that is throbbing and shooting down her right leg.  She denies any loss of bowel bladder control, no groin numbness, states that she has pain with lifting her right foot but does not have foot drop and an inability to lift it.  She denies any history of IV drug use.  States that she has had back pain since 2007, has been taking her gabapentin, Zanaflex and oxycodone.  States that she has a chronic pain specialist but has not seen a neurosurgeon or spine specialist.  Denies any fevers or chills, no night sweats, unintended weight loss, no history of cancer.    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     No family history on file.  Social History   Tobacco Use   Smoking status: Never Smoker   Smokeless tobacco: Never Used  Vaping Use   Vaping Use: Never used  Substance Use Topics   Alcohol use: No   Drug use: No    Home Medications Prior to Admission medications   Medication Sig Start Date End Date Taking? Authorizing Provider  busPIRone (BUSPAR) 10 MG tablet 2  bid 07/26/19  Yes Plovsky, Earvin Hansen, MD  naproxen (NAPROSYN) 375 MG tablet Take 1 tablet (375 mg total) by mouth 2 (two) times daily. 04/06/20  Yes Farrel Gordon, PA-C  oxycodone (OXY-IR) 5 MG capsule Take 10 mg by mouth every 4 (four) hours as needed.   Yes [provider]  traZODone (DESYREL) 100 MG tablet 3  qhs 07/26/19  Yes Plovsky, Earvin Hansen, MD  benzonatate [facility])  100 MG capsule Take 1 capsule (100 mg total) by mouth every 8 (eight) hours. 06/01/20   Hyman Hopes, Margaux, PA-C  cyclobenzaprine (FLEXERIL) 10 MG tablet Take 1 tablet (10 mg total) by mouth 2 (two) times daily as needed for muscle spasms. 04/06/20   Farrel Gordon, PA-C  fluticasone (FLONASE) 50 MCG/ACT nasal spray Place 2 sprays into both nostrils daily.    [provider]  gabapentin (NEURONTIN) 300 MG capsule Take 300 mg by mouth 3 (three) times daily.    [provider]  ibuprofen (ADVIL,MOTRIN) 600 MG tablet Take 1 tablet (600 mg total) by mouth every 6 (six) hours as needed. 11/06/14   Lurene Shadow, PA-C  methylPREDNISolone (MEDROL DOSEPAK) 4 MG TBPK tablet Take as directed 06/18/20   Mare Ferrari, PA-C  pregabalin (LYRICA) 150 MG capsule Take 150 mg  by mouth 3 (three) times daily.    [provider]    Allergies    Penicillins and Tylenol [acetaminophen]  Review of Systems   Review of Systems  Respiratory: Negative for shortness of breath.   Cardiovascular: Negative for chest pain and leg swelling.  Musculoskeletal: Positive for back pain and gait problem.  Skin: Negative for rash.  Neurological: Negative for tremors, weakness and numbness.    Physical Exam Updated Vital Signs BP (!) 148/92 (BP Location: Right Arm)    Pulse 85    Temp 98.2 F (36.8 C) (Oral)    Resp 18    Ht 5\' 7"  (1.702 m)    Wt 133.4 kg    SpO2 100%    BMI 46.06 kg/m   Physical Exam Vitals and nursing note reviewed.  Constitutional:      General: She is not in acute distress.    Appearance: She is well-developed. She is obese. She is not ill-appearing or diaphoretic.  HENT:     Head: Normocephalic and atraumatic.  Eyes:     Conjunctiva/sclera: Conjunctivae normal.  Cardiovascular:     Rate and Rhythm: Normal rate and regular rhythm.     Heart sounds: No murmur heard.   Pulmonary:     Effort: Pulmonary effort is normal. No respiratory distress.     Breath sounds: Normal breath sounds.  Abdominal:     Palpations: Abdomen is soft.     Tenderness: There is no abdominal tenderness. There is no right CVA tenderness or left CVA tenderness.  Musculoskeletal:        General: Tenderness present. No deformity or signs of injury.     Cervical back: Normal range of motion and neck supple.     Comments: No C, there is some T L-spine tenderness but no noticeable step-offs, crepitus, fluctuance, erythema.  5/5 strength in upper and lower extremities.  Sensations intact.  Full range of motion and strength of neck. Right leg with full strength but limited ROM secondary to pain.      Skin:    General: Skin is warm and dry.     Findings: No erythema or rash.  Neurological:     General: No focal deficit present.     Mental Status: She is alert and  oriented to person, place, and time.     Sensory: No sensory deficit.     Motor: No weakness.     ED Results / Procedures / Treatments   Labs (all labs ordered are listed, but only abnormal results are displayed) Labs Reviewed - No data to display  EKG None  Radiology DG Thoracic Spine 2  View  Result Date: 06/18/2020 CLINICAL DATA:  Chronic back pain. Increased pain after chiropractic adjustment 2 days ago. EXAM: THORACIC SPINE 2 VIEWS COMPARISON:  None. FINDINGS: Lateral view including the upper thoracic spine partially obscured by overlapping osseous and soft tissue structures. The alignment is maintained. Vertebral body heights are maintained. No evidence of acute fracture. There is mild disc space narrowing and endplate spurring in the lower thoracic spine. Posterior elements appear intact. There is no paravertebral soft tissue abnormality. IMPRESSION: Mild degenerative change in the lower thoracic spine. No evidence of acute fracture. Electronically Signed   By: Narda RutherfordMelanie  Sanford M.D.   On: 06/18/2020 16:09   DG Lumbar Spine Complete  Result Date: 06/18/2020 CLINICAL DATA:  Chronic back pain, increased after chiropractic adjustment 2 days ago. EXAM: LUMBAR SPINE - COMPLETE 4+ VIEW COMPARISON:  None. FINDINGS: Five non-rib-bearing lumbar vertebra. The alignment is maintained. Vertebral body heights are normal. There is no listhesis. The posterior elements are intact. Disc spaces are preserved with mild endplate spurring at T12-L1 no L4-L5. No fracture or evidence of focal bone lesion. Sacroiliac joints are symmetric and normal. IMPRESSION: Mild degenerative change in the lumbar spine without acute fracture or subluxation. Electronically Signed   By: Narda RutherfordMelanie  Sanford M.D.   On: 06/18/2020 16:10    Procedures Procedures (including critical care time)  Medications Ordered in ED Medications  ketorolac (TORADOL) injection 60 mg (60 mg Intramuscular Given 06/18/20 1508)  dexamethasone  (DECADRON) injection 10 mg (10 mg Intramuscular Given 06/18/20 1619)    ED Course  I have reviewed the triage vital signs and the nursing notes.  Pertinent labs & imaging results that were available during my care of the patient were reviewed by me and considered in my medical decision making (see chart for details).    MDM Rules/Calculators/A&P                         49 year old female with a history of chronic low back pain with an exacerbation of back pain after visiting a chiropractor On presentation, she is uncomfortable appearing, however nontoxic, no acute distress, vitals overall reassuring.  She does have some midline tenderness to the T and L-spine and some right-sided associated paraspinal muscle tenderness of the L-spine.  There is no noticeable step-offs or crepitus, she does have full strength, limited range of motion of the right leg secondary to pain.  Gross sensations intact.  No concern for cauda equina, no history of IVDU, or cancer.  I repeated plain films of the T and L-spine to rule out any acute fractures from the adjustment which were negative.  No acute changes were noted.  Patient received a shot of Toradol here, Decadron, will send home with Medrol Dosepak.  Patient states that she has a prescription for Zanaflex and does not want Robaxin or Flexeril as they make her leg swell.  She is already on chronic opioids.  Patient has not followed with a neurosurgeon or spine specialist, will give her referral today.  Encouraged follow-up with her pain specialist.  Return precautions discussed.  She voiced understanding and is agreeable.  At this stage of the ED course, the patient has been medically screened and stable for discharge.  Final Clinical Impression(s) / ED Diagnoses Final diagnoses:  Chronic right-sided low back pain with right-sided sciatica    Rx / DC Orders ED Discharge Orders         Ordered    methylPREDNISolone (MEDROL DOSEPAK)  4 MG TBPK tablet         06/18/20 1634           Leone Brand 06/18/20 1636    Melene Plan, DO 06/18/20 1819

## 2021-01-02 ENCOUNTER — Other Ambulatory Visit: Payer: Self-pay

## 2021-01-02 ENCOUNTER — Encounter (HOSPITAL_BASED_OUTPATIENT_CLINIC_OR_DEPARTMENT_OTHER): Payer: Self-pay | Admitting: Emergency Medicine

## 2021-01-02 ENCOUNTER — Emergency Department (HOSPITAL_BASED_OUTPATIENT_CLINIC_OR_DEPARTMENT_OTHER)
Admission: EM | Admit: 2021-01-02 | Discharge: 2021-01-02 | Disposition: A | Discharge status: Home / Self Care | Payer: Medicaid Other | Attending: Emergency Medicine | Admitting: Emergency Medicine

## 2021-01-02 DIAGNOSIS — M545 Low back pain, unspecified: Secondary | ICD-10-CM | POA: Insufficient documentation

## 2021-01-02 DIAGNOSIS — M25552 Pain in left hip: Secondary | ICD-10-CM | POA: Insufficient documentation

## 2021-01-02 MED ORDER — DEXAMETHASONE SODIUM PHOSPHATE 10 MG/ML IJ SOLN
10.0000 mg | Freq: Once | INTRAMUSCULAR | Status: AC
  Administered 2021-01-02: 10 mg via INTRAMUSCULAR
  Filled 2021-01-02: qty 1

## 2021-01-02 MED ORDER — KETOROLAC TROMETHAMINE 60 MG/2ML IM SOLN
60.0000 mg | Freq: Once | INTRAMUSCULAR | Status: AC
  Administered 2021-01-02: 60 mg via INTRAMUSCULAR
  Filled 2021-01-02: qty 2

## 2021-01-02 NOTE — Discharge Instructions (Addendum)
You have been seen here for left hip pain, please continue with your home medications as prescribed.  I recommend taking over-the-counter pain medications like ibuprofen and/or Tylenol every 6 as needed.  Please follow dosage and on the back of bottle.  I also recommend applying heat to the area and stretching out the muscles as this will help decrease stiffness and pain.  I have given you information on exercises please follow.   Is important that you follow-up with your neurosurgeon for further work-up of your back pain.  Come back to the emergency department if you develop chest pain, shortness of breath, severe abdominal pain, uncontrolled nausea, vomiting, diarrhea.

## 2021-01-02 NOTE — ED Notes (Signed)
Triage stopped to be taken to [facility] via W/C, per request

## 2021-01-02 NOTE — ED Provider Notes (Signed)
[facility] EMERGENCY DEPARTMENT Provider Note   CSN: [id number] Arrival date & time: 01/02/21  1750     History Chief Complaint  Patient presents with  . Hip Pain    Debra Cardenas is a 50 y.o. female.  HPI   Patient with significant medical history of fibromyalgia, chronic lower back pain, presents with chief complaint of left-sided lower back and hip pain.  She endorses being on for last 3 weeks, she describes the pain as a sharp burning-like sensation which she feels in her left lower back and goes down to her left thigh, she denies paresthesias or weakness in the lower extremities, denies urinary incontinency, retention, difficult bowel movements.  She denies  associated lower flank pain, abdominal pain, nausea, vomiting, diarrhea, denies urinary symptoms.  She denies recent trauma to the area, denies IV drug use, denies associated fevers or chills.  Patient states that she is seeing her pain management doctor, she had a short course of steroids, is on Flexeril, opiates, Lyrica have not been 5 or much relief.  She endorses that she was seen here a few months ago and received Toradol with Decadron she states that made her feel a lot better.  Patient denies headaches, fevers, chills, shortness breath, chest pain, abdominal pain, nausea, vomiting, diarrhea, worsening pedal edema.  Past Medical History:  Diagnosis Date  . Adjustment disorder with mixed anxiety and depressed mood   . Anxiety   . Bipolar 1 disorder (HCC)   . Chronic pain   . Depression   . Fibromyalgia   . Migraine   . Morbid obesity (HCC)   . MRSA (methicillin resistant staph aureus) culture positive   . Myalgia and myositis   . Opiate dependence [facility])     Patient Active Problem List   Diagnosis Date Noted  . 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 Generalized anxiety disorder 01/18/2012    Past Surgical History:  Procedure Laterality Date  . ABDOMINAL HYSTERECTOMY    . CESAREAN  SECTION    . EAR CYST EXCISION     x3  . KNEE ARTHROPLASTY    . TUBAL LIGATION    . uterine ablasion       OB History   No obstetric history on file.     No family history on file.  Social History   Tobacco Use  . Smoking status: Never Smoker  . Smokeless tobacco: Never Used  Vaping Use  . Vaping Use: Never used  Substance Use Topics  . Alcohol use: No  . Drug use: No    Home Medications Prior to Admission medications   Medication Sig Start Date End Date Taking? Authorizing Provider  benzonatate (TESSALON) 100 MG capsule Take 1 capsule (100 mg total) by mouth every 8 (eight) hours. 06/01/20   Tanda Rockers, PA-C  busPIRone (BUSPAR) 10 MG tablet 2  bid 07/26/19   Plovsky, Earvin Hansen, MD  cyclobenzaprine (FLEXERIL) 10 MG tablet Take 1 tablet (10 mg total) by mouth 2 (two) times daily as needed for muscle spasms. 04/06/20   Farrel Gordon, PA-C  fluticasone (FLONASE) 50 MCG/ACT nasal spray Place 2 sprays into both nostrils daily.    [provider]  gabapentin (NEURONTIN) 300 MG capsule Take 300 mg by mouth 3 (three) times daily.    [provider]  ibuprofen (ADVIL,MOTRIN) 600 MG tablet Take 1 tablet (600 mg total) by mouth every 6 (six) hours as needed. 11/06/14   Lurene Shadow, PA-C  methylPREDNISolone (MEDROL DOSEPAK) 4  MG TBPK tablet Take as directed 06/18/20   Mare Ferrari, PA-C  naproxen (NAPROSYN) 375 MG tablet Take 1 tablet (375 mg total) by mouth 2 (two) times daily. 04/06/20   Farrel Gordon, PA-C  oxyCODONE (OXY IR/ROXICODONE) 5 MG immediate release tablet TAKE ONE TABLET BY MOUTH TWICE A DAY AS NEEDED FOR 30 DAYS FILL DATE 12/28/2020 12/29/20   [provider]  oxycodone (OXY-IR) 5 MG capsule Take 10 mg by mouth every 4 (four) hours as needed.    [provider]  pregabalin (LYRICA) 150 MG capsule Take 150 mg by mouth 3 (three) times daily.    [provider]  traZODone (DESYREL) 100 MG tablet 3  qhs 07/26/19   Plovsky, Earvin Hansen,  MD    Allergies    Penicillins and Tylenol [acetaminophen]  Review of Systems   Review of Systems  Constitutional: Negative for chills and fever.  HENT: Negative for congestion and tinnitus.   Respiratory: Negative for shortness of breath.   Cardiovascular: Negative for chest pain.  Gastrointestinal: Negative for abdominal pain, diarrhea, nausea and vomiting.  Genitourinary: Negative for dysuria and enuresis.  Musculoskeletal: Positive for back pain.  Skin: Negative for rash.  Neurological: Negative for dizziness and headaches.  Hematological: Does not bruise/bleed easily.    Physical Exam Updated Vital Signs BP (!) 137/110 (BP Location: Right Arm)   Pulse 97   Temp 98.2 F (36.8 C) (Oral)   Resp 20   Ht 5\' 7"  (1.702 m)   Wt (!) 137.4 kg   SpO2 97%   BMI 47.46 kg/m   Physical Exam Vitals and nursing note reviewed.  Constitutional:      General: She is not in acute distress.    Appearance: She is not ill-appearing.  HENT:     Head: Normocephalic and atraumatic.     Nose: No congestion.  Eyes:     Conjunctiva/sclera: Conjunctivae normal.  Cardiovascular:     Rate and Rhythm: Normal rate and regular rhythm.     Pulses: Normal pulses.     Heart sounds: No murmur heard. No friction rub. No gallop.   Pulmonary:     Effort: No respiratory distress.     Breath sounds: No wheezing, rhonchi or rales.  Abdominal:     Palpations: Abdomen is soft.     Tenderness: There is no abdominal tenderness.  Musculoskeletal:     Right lower leg: No edema.     Left lower leg: No edema.     Comments: Patient spine was palpated it was nontender to palpation, no step-off or deformities noted.  Patient noted tenderness along the paraspinal muscles, patient has full range of motion 5 5 strength in the lower extremities, neurovascularly intact.  She had positive straight leg raise of the left side.  Skin:    General: Skin is warm and dry.  Neurological:     Mental Status: She is alert.   Psychiatric:        Mood and Affect: Mood normal.     ED Results / Procedures / Treatments   Labs (all labs ordered are listed, but only abnormal results are displayed) Labs Reviewed - No data to display  EKG None  Radiology No results found.  Procedures Procedures   Medications Ordered in ED Medications  dexamethasone (DECADRON) injection 10 mg (has no administration in time range)  ketorolac (TORADOL) injection 60 mg (60 mg Intramuscular Given 01/02/21 1848)    ED Course  I have reviewed the triage vital  signs and the nursing notes.  Pertinent labs & imaging results that were available during my care of the patient were reviewed by me and considered in my medical decision making (see chart for details).    MDM Rules/Calculators/A&P                         Initial impression-patient presents with left-sided hip pain.  She is alert, does not appear in acute distress, vital signs reassuring.  Will provide patient with a IM injection of Toradol and Decadron as this helped her in the past.  Work-up-due to well-appearing patient, benign physical exam, further lab work imaging not warranted at this time.  Rule out- I have low suspicion for spinal fracture or spinal cord abnormality as patient denies urinary incontinency, retention, difficulty with bowel movements, denies saddle paresthesias.  Spine was palpated there is no step-off, crepitus or gross deformities felt, patient had 5/5 strength, full range of motion, neurovascular fully intact in the lower extremities.  Will defer imaging as patient denies traumatic injury to the area.  Low suspicion for UTI, pyelonephritis, kidney stones as patient denies urinary symptoms, patient has no CVA tenderness noted my exam.. Low suspicion for septic arthritis as patient denies IV drug use, skin exam was performed no erythematous, edema or warm joints noted.   Plan-suspect patient suffering from a muscular strain of her lower back  resulting in sciatica of the left leg.  Will recommend she continues with her home medications, provide her with exercises, follow-up with neurosurgery for further evaluation.  Vital signs have remained stable, no indication for hospital admission.Patient given at home care as well strict return precautions.  Patient verbalized that they understood agreed to said plan.   Final Clinical Impression(s) / ED Diagnoses Final diagnoses:  Left hip pain    Rx / DC Orders ED Discharge Orders    None       Carroll Sage, PA-C 01/02/21 1849    Virgina Norfolk, DO 01/02/21 2206

## 2021-01-02 NOTE — ED Triage Notes (Signed)
Left hip pain radiating to left leg x 3 weeks

## 2021-06-26 IMAGING — CR DG LUMBAR SPINE COMPLETE 4+V
5 series · 5 of 5 positions shown · non-contrast
Comparison: None.

CLINICAL DATA: Chronic back pain, increased after chiropractic
adjustment 2 days ago.

EXAM:
LUMBAR SPINE - COMPLETE 4+ VIEW

[t l-spine a.p.]
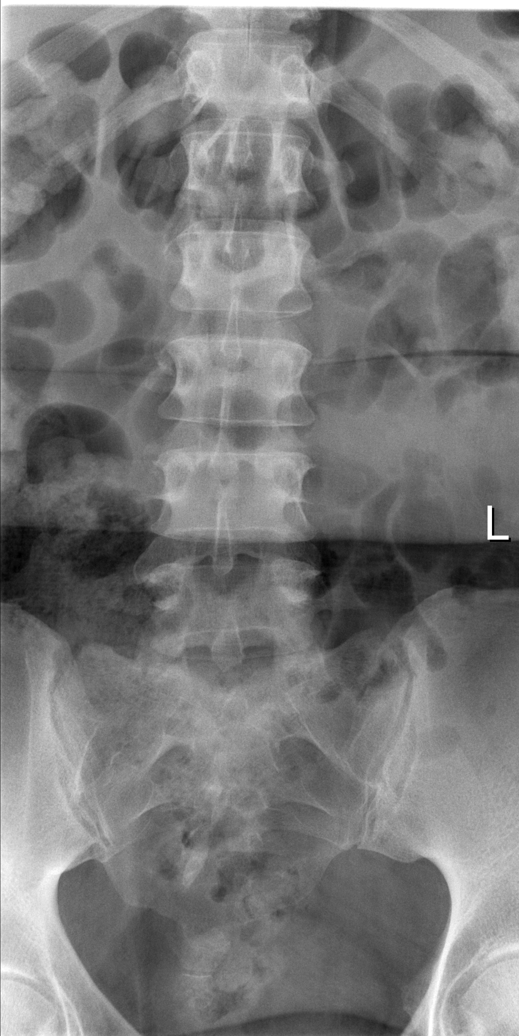

[t l-spine oblique exposure (1 of 2)]
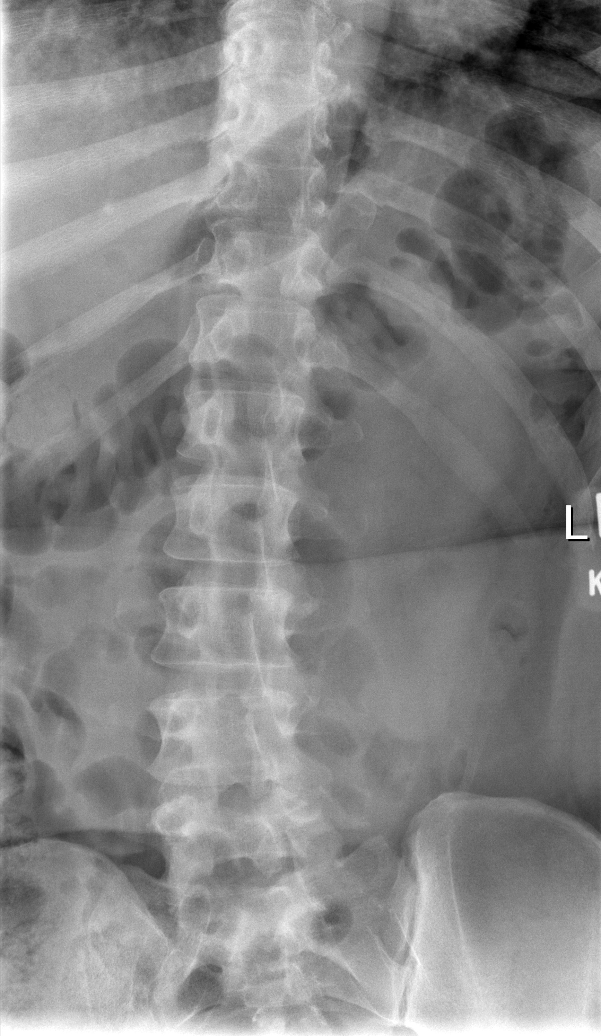

[t l-spine oblique exposure (2 of 2)]
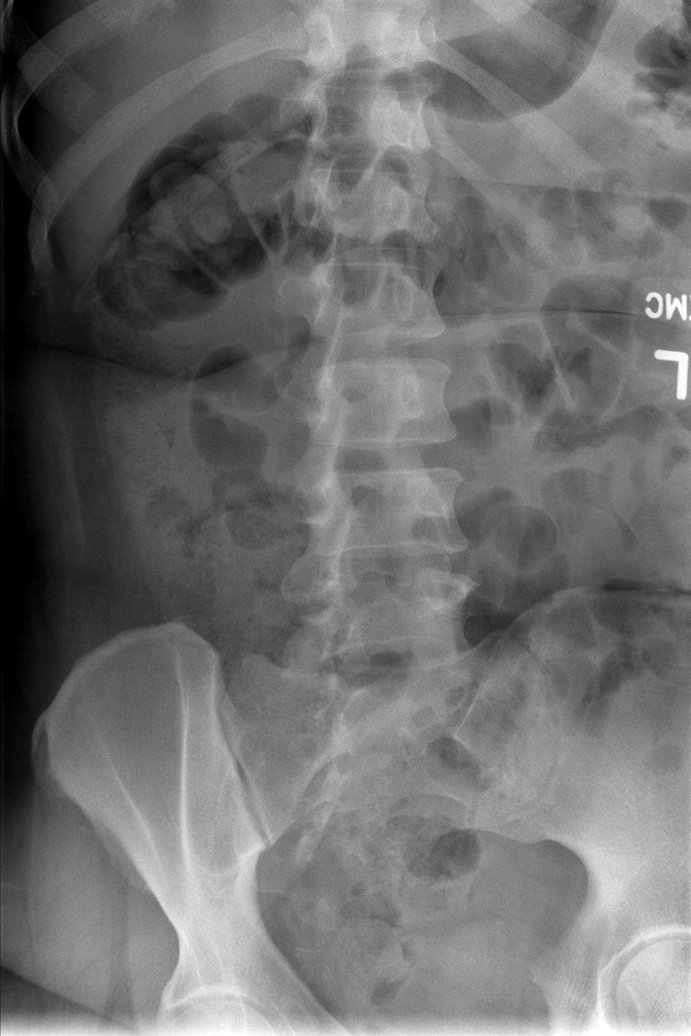

[t l-spine lat]
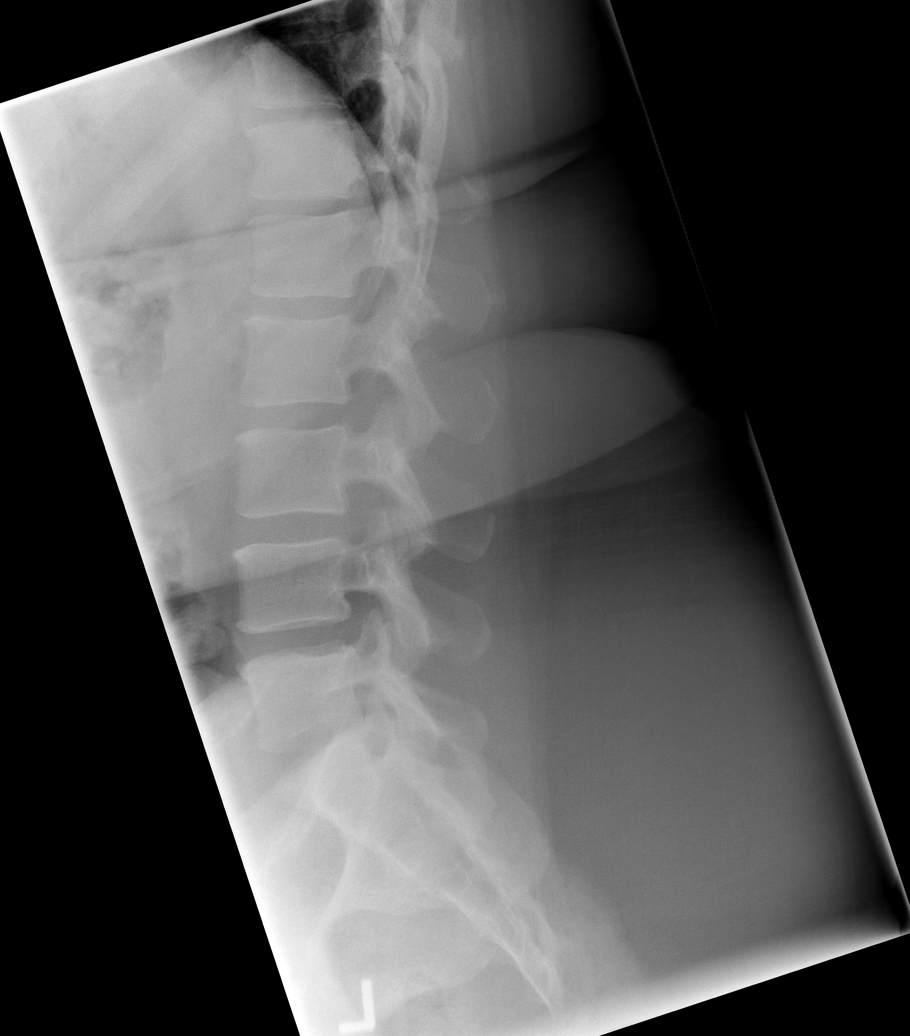

[t l-spine l5-s1 spot]
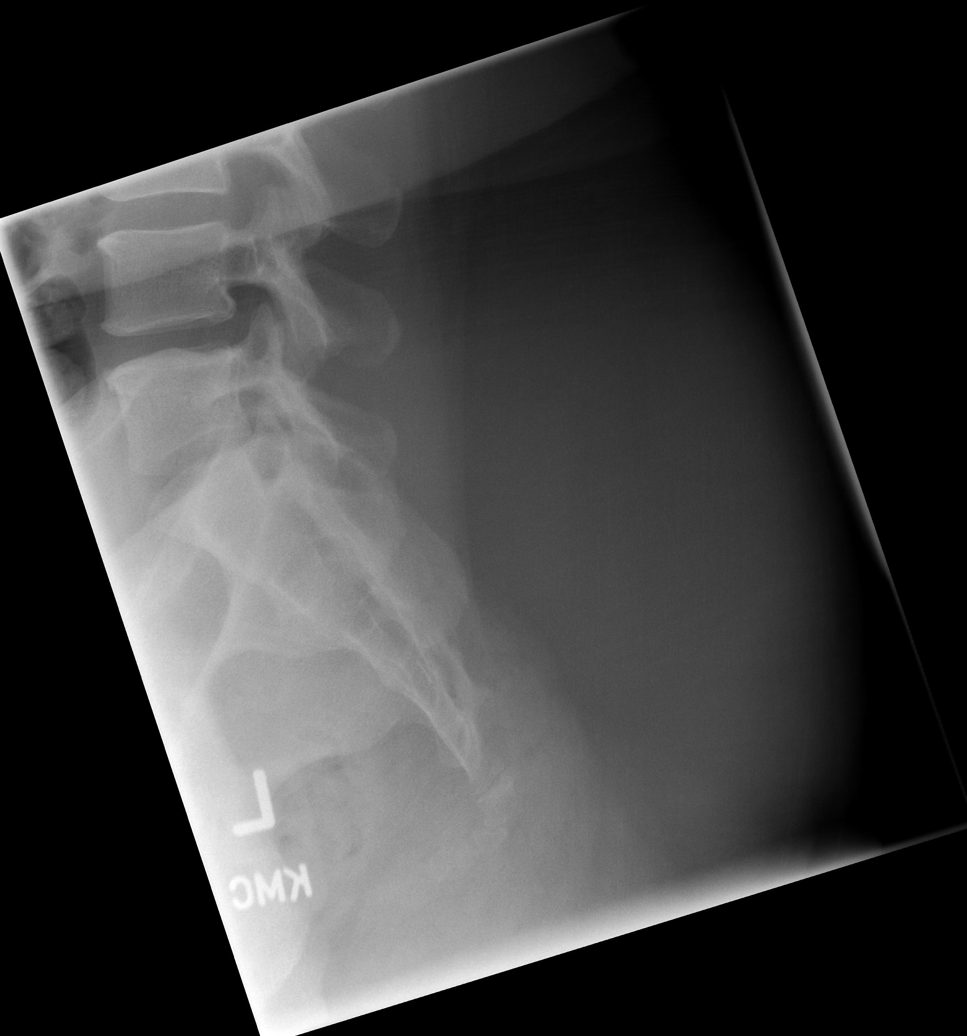

[5 of 5 positions shown; findings below may reference images not displayed]

FINDINGS: Five non-rib-bearing lumbar vertebra. The alignment is maintained.
Vertebral body heights are normal. There is no listhesis. The
posterior elements are intact. Disc spaces are preserved with mild
endplate spurring at T12-L1 no L4-L5. No fracture or evidence of
focal bone lesion. Sacroiliac joints are symmetric and normal.
IMPRESSION: Mild degenerative change in the lumbar spine without acute fracture
or subluxation.

## 2022-05-16 ENCOUNTER — Other Ambulatory Visit: Payer: Self-pay

## 2022-05-16 ENCOUNTER — Emergency Department (HOSPITAL_BASED_OUTPATIENT_CLINIC_OR_DEPARTMENT_OTHER)
Admission: EM | Admit: 2022-05-16 | Discharge: 2022-05-16 | Discharge status: Against Medical Advice | Payer: Medicaid Other

## 2022-05-16 NOTE — ED Notes (Signed)
Pt in triage and decided she did not wait to be seen. State she will come back later

## 2022-07-09 ENCOUNTER — Emergency Department (HOSPITAL_BASED_OUTPATIENT_CLINIC_OR_DEPARTMENT_OTHER): Payer: BLUE CROSS/BLUE SHIELD

## 2022-07-09 ENCOUNTER — Encounter (HOSPITAL_BASED_OUTPATIENT_CLINIC_OR_DEPARTMENT_OTHER): Payer: Self-pay | Admitting: Emergency Medicine

## 2022-07-09 ENCOUNTER — Emergency Department (HOSPITAL_BASED_OUTPATIENT_CLINIC_OR_DEPARTMENT_OTHER)
Admission: EM | Admit: 2022-07-09 | Discharge: 2022-07-09 | Disposition: A | Discharge status: Home / Self Care | Payer: BLUE CROSS/BLUE SHIELD | Attending: Emergency Medicine | Admitting: Emergency Medicine

## 2022-07-09 ENCOUNTER — Other Ambulatory Visit: Payer: Self-pay

## 2022-07-09 DIAGNOSIS — Z79899 Other long term (current) drug therapy: Secondary | ICD-10-CM | POA: Insufficient documentation

## 2022-07-09 DIAGNOSIS — R002 Palpitations: Secondary | ICD-10-CM | POA: Insufficient documentation

## 2022-07-09 DIAGNOSIS — R0789 Other chest pain: Secondary | ICD-10-CM | POA: Diagnosis present

## 2022-07-09 DIAGNOSIS — R0602 Shortness of breath: Secondary | ICD-10-CM | POA: Diagnosis not present

## 2022-07-09 DIAGNOSIS — I10 Essential (primary) hypertension: Secondary | ICD-10-CM | POA: Diagnosis not present

## 2022-07-09 DIAGNOSIS — R079 Chest pain, unspecified: Secondary | ICD-10-CM

## 2022-07-09 HISTORY — DX: Essential (primary) hypertension: I10

## 2022-07-09 HISTORY — DX: Prediabetes: R73.03

## 2022-07-09 LAB — CBC WITH DIFFERENTIAL/PLATELET
Abs Immature Granulocytes: 0.02 10*3/uL (ref 0.00–0.07)
Basophils Absolute: 0 10*3/uL (ref 0.0–0.1)
Basophils Relative: 1 %
Eosinophils Absolute: 0 10*3/uL (ref 0.0–0.5)
Eosinophils Relative: 1 %
HCT: 38.1 % (ref 36.0–46.0)
Hemoglobin: 12.2 g/dL (ref 12.0–15.0)
Immature Granulocytes: 0 %
Lymphocytes Relative: 22 %
Lymphs Abs: 2 10*3/uL (ref 0.7–4.0)
MCH: 27 pg (ref 26.0–34.0)
MCHC: 32 g/dL (ref 30.0–36.0)
MCV: 84.3 fL (ref 80.0–100.0)
Monocytes Absolute: 1 10*3/uL (ref 0.1–1.0)
Monocytes Relative: 11 %
Neutro Abs: 5.7 10*3/uL (ref 1.7–7.7)
Neutrophils Relative %: 65 %
Platelets: 411 10*3/uL — ABNORMAL HIGH (ref 150–400)
RBC: 4.52 MIL/uL (ref 3.87–5.11)
RDW: 16.1 % — ABNORMAL HIGH (ref 11.5–15.5)
WBC: 8.8 10*3/uL (ref 4.0–10.5)
nRBC: 0 % (ref 0.0–0.2)

## 2022-07-09 LAB — TROPONIN I (HIGH SENSITIVITY)
Troponin I (High Sensitivity): <2 ng/L (ref <18)
Troponin I (High Sensitivity): 2 ng/L (ref <18)

## 2022-07-09 LAB — COMPREHENSIVE METABOLIC PANEL
ALT: 12 U/L (ref 0–44)
AST: 19 U/L (ref 15–41)
Albumin: 3.7 g/dL (ref 3.5–5.0)
Alkaline Phosphatase: 87 U/L (ref 38–126)
Anion gap: 4 — ABNORMAL LOW (ref 5–15)
BUN: 15 mg/dL (ref 6–20)
CO2: 24 mmol/L (ref 22–32)
Calcium: 9.1 mg/dL (ref 8.9–10.3)
Chloride: 110 mmol/L (ref 98–111)
Creatinine, Ser: 0.61 mg/dL (ref 0.44–1.00)
GFR, Estimated: >60 mL/min (ref >60)
Glucose, Bld: 101 mg/dL — ABNORMAL HIGH (ref 70–99)
Potassium: 4.2 mmol/L (ref 3.5–5.1)
Sodium: 138 mmol/L (ref 135–145)
Total Bilirubin: 0.4 mg/dL (ref 0.3–1.2)
Total Protein: 8.2 g/dL — ABNORMAL HIGH (ref 6.5–8.1)

## 2022-07-09 MED ORDER — HYDROXYZINE HCL 25 MG PO TABS: 25.0000 mg | ORAL_TABLET | Freq: Four times a day (QID) | ORAL | 0 refills | Status: AC | PRN

## 2022-07-09 MED ORDER — HYDROXYZINE HCL 25 MG PO TABS
25.0000 mg | ORAL_TABLET | Freq: Once | ORAL | Status: AC
  Administered 2022-07-09: 25 mg via ORAL
  Filled 2022-07-09: qty 1

## 2022-07-09 MED ORDER — MORPHINE SULFATE (PF) 2 MG/ML IV SOLN: 2.0000 mg | Freq: Once | INTRAVENOUS | Status: DC

## 2022-07-09 NOTE — ED Notes (Signed)
Patient reports pain in the center of her chest.States that it hurts to breath. States that she has shortness of breath when she has pain

## 2022-07-09 NOTE — ED Triage Notes (Signed)
Patient complaint of generalized chest pain onset 10:30pm. Patient reports symptoms associated with shortness of breath. Patient has history of anxiety.

## 2022-07-09 NOTE — ED Provider Notes (Signed)
[name] EM[facility]GENCY DEPARTMENT Provider Note   CSN: [id number] Arrival date & time: 07/09/22  1048     History  Chief Complaint  Patient presents with   Chest Pain    Debra Cardenas is a 51 y.o. female, history of hypertension, prediabetes, who presents to the ED secondary to chest discomfort for the last 12 hours.  She states last night she was getting into bed, when she started having palpitations, and chest pain that lasted for about 5 minutes.  Has had around 3-4 episodes in addition of this.  Typically is associated with palpitations and the chest discomfort, however today she had 1 episode where which is chest pain.  States the pain is stabbing in nature 10 out of 10, and localized to the middle of her chest.  She does additionally have shortness of breath with this.  No pain when taking deep breath, however she does have some relief when she takes deep breaths and relaxes.  She notes she has a history of anxiety, and has panic attacks.  Has been under more stress lately, with a new diagnosis of prediabetes.  Wants her heart checked out.  Denies any nausea, vomiting, abdominal pain.  No radiation of the chest pain.  Has never had any coronary artery issues.  Denies any recent surgeries, trauma.     Home Medications Prior to Admission medications   Medication Sig Start Date End Date Taking? Authorizing Provider  hydrOXYzine (ATARAX) 25 MG tablet Take 1 tablet (25 mg total) by mouth every 6 (six) hours as needed for anxiety. 07/09/22  Yes Jeno Calleros L, PA  benzonatate (TESSALON) 100 MG capsule Take 1 capsule (100 mg total) by mouth every 8 (eight) hours. 06/01/20   Eustaquio Maize, PA-C  busPIRone (BUSPAR) 10 MG tablet 2  bid 07/26/19   Plovsky, Berneta Sages, MD  cyclobenzaprine (FLEXERIL) 10 MG tablet Take 1 tablet (10 mg total) by mouth 2 (two) times daily as needed for muscle spasms. 04/06/20   Alfredia Client, PA-C  fluticasone (FLONASE) 50 MCG/ACT nasal spray Place 2  sprays into both nostrils daily.    [provider]  gabapentin (NEURONTIN) 300 MG capsule Take 300 mg by mouth 3 (three) times daily.    [provider]  ibuprofen (ADVIL,MOTRIN) 600 MG tablet Take 1 tablet (600 mg total) by mouth every 6 (six) hours as needed. 11/06/14   Noe Gens, PA-C  methylPREDNISolone (MEDROL DOSEPAK) 4 MG TBPK tablet Take as directed 06/18/20   Garald Balding, PA-C  naproxen (NAPROSYN) 375 MG tablet Take 1 tablet (375 mg total) by mouth 2 (two) times daily. 04/06/20   Alfredia Client, PA-C  oxyCODONE (OXY IR/ROXICODONE) 5 MG immediate release tablet TAKE ONE TABLET BY MOUTH TWICE A DAY AS NEEDED FOR 30 DAYS FILL DATE 12/28/2020 12/29/20   [provider]  oxycodone (OXY-IR) 5 MG capsule Take 10 mg by mouth every 4 (four) hours as needed.    [provider]  pregabalin (LYRICA) 150 MG capsule Take 150 mg by mouth 3 (three) times daily.    [provider]  traZODone (DESYREL) 100 MG tablet 3  qhs 07/26/19   Plovsky, Berneta Sages, MD      Allergies    Penicillins and Tylenol [acetaminophen]    Review of Systems   Review of Systems  Constitutional:  Negative for chills and fever.  Respiratory:  Negative for shortness of breath.   Cardiovascular:  Positive for chest pain.  Gastrointestinal:  Negative for nausea  and vomiting.    Physical Exam Updated Vital Signs BP 135/64   Pulse 82   Temp 97.7 F (36.5 C) (Oral)   Resp 20   Ht 5\' 7"  (1.702 m)   Wt 126.6 kg   SpO2 100%   BMI 43.70 kg/m  Physical Exam Vitals and nursing note reviewed.  Constitutional:      General: She is not in acute distress.    Appearance: She is well-developed.  HENT:     Head: Normocephalic and atraumatic.  Eyes:     Conjunctiva/sclera: Conjunctivae normal.  Cardiovascular:     Rate and Rhythm: Normal rate and regular rhythm.     Heart sounds: No murmur heard. Pulmonary:     Effort: Pulmonary effort is normal. No respiratory distress.      Breath sounds: Normal breath sounds.  Chest:     Chest wall: No tenderness.  Abdominal:     Palpations: Abdomen is soft.     Tenderness: There is no abdominal tenderness.  Musculoskeletal:        General: No swelling.     Cervical back: Neck supple.  Skin:    General: Skin is warm and dry.     Capillary Refill: Capillary refill takes less than 2 seconds.  Neurological:     General: No focal deficit present.     Mental Status: She is alert.  Psychiatric:        Mood and Affect: Mood normal.     ED Results / Procedures / Treatments   Labs (all labs ordered are listed, but only abnormal results are displayed) Labs Reviewed  CBC WITH DIFFERENTIAL/PLATELET - Abnormal; Notable for the following components:      Result Value   RDW 16.1 (*)    Platelets 411 (*)    All other components within normal limits  COMPREHENSIVE METABOLIC PANEL - Abnormal; Notable for the following components:   Glucose, Bld 101 (*)    Total Protein 8.2 (*)    Anion gap 4 (*)    All other components within normal limits  TROPONIN I (HIGH SENSITIVITY)  TROPONIN I (HIGH SENSITIVITY)    EKG EKG Interpretation  Date/Time:  Saturday July 09 2022 15:08:59 EDT Ventricular Rate:  75 PR Interval:  162 QRS Duration: 88 QT Interval:  352 QTC Calculation: 394 R Axis:   98 Text Interpretation: Sinus rhythm Borderline right axis deviation Borderline T abnormalities, diffuse leads No significant change since last tracing Confirmed by [id number]) on 07/09/2022 3:12:42 PM  Radiology DG Chest 1 View  Result Date: 07/09/2022 CLINICAL DATA:  Chest pain and palpitations. EXAM: CHEST  1 VIEW COMPARISON:  Chest radiograph 06/01/2020 FINDINGS: Moderate is normal in size for technique.The cardiomediastinal contours are normal. The lungs are clear. Pulmonary vasculature is normal. No consolidation, pleural effusion, or pneumothorax. No acute osseous abnormalities are seen. IMPRESSION: No acute chest  findings. Electronically Signed   By: 06/03/2020 M.D.   On: 07/09/2022 12:42    Procedures Procedures    Medications Ordered in ED Medications  hydrOXYzine (ATARAX) tablet 25 mg (25 mg Oral Given 07/09/22 1229)    ED Course/ Medical Decision Making/ A&P                           Medical Decision Making Patient is a 51 year old female, history of hypertension, prediabetes, here for chest discomfort for the last 12 hours has been intermittent mended in nature.  Has mild  associated shortness of breath with these episodes that resolves, and deep breathing makes these episodes go away.  Her symptoms sound very much like a panic attack, however given her age, will evaluate further.  Troponins, EKG, CBC, CMP for further evaluation along with chest x-ray.  Will give hydroxyzine for anxiety.  She is nonhypoxic, denies any recent surgery, not tachycardic, and no history of cancer, or trauma.  Amount and/or Complexity of Data Reviewed Labs: ordered.    Details: Troponins less than 2 and 2. Radiology: ordered.    Details: Chest x-ray unremarkable. Discussion of management or test interpretation with external provider(s): HEART 3.  Chest pain resolved with Atarax, states she feels much better and improved.  Reviewed labs with patient, she voiced understanding, unlikely ACS.  Improved with anxiolytics.  Discussed follow-up with PCP, need for possible additional anxiolytics.  Hydroxyzine prescription prescribed on discharge.  Return precautions emphasized.  Symptoms compatible with panic attack which she states she has had in the past, and she notes that she has been under more stress since her prediabetes diagnosis.  Risk Prescription drug management.    Final Clinical Impression(s) / ED Diagnoses Final diagnoses:  Chest pain, unspecified type    Rx / DC Orders ED Discharge Orders          Ordered    hydrOXYzine (ATARAX) 25 MG tablet  Every 6 hours PRN        07/09/22 1504               Maxson Oddo Elbert Ewings, PA 07/09/22 1547    Vanetta Mulders, MD 07/10/22 [phone]

## 2022-07-09 NOTE — Discharge Instructions (Addendum)
Today your heart looks good, recommend following up with your PCP for further evaluation.  Please take the hydroxyzine as needed for anxiety.  Recommend that you speak to your PCP regarding your anxiety medications and need for increasing them.  If you develop severe chest pain, shortness of breath, severe nausea or vomiting please return to the ER.

## 2022-10-12 ENCOUNTER — Ambulatory Visit (HOSPITAL_BASED_OUTPATIENT_CLINIC_OR_DEPARTMENT_OTHER): Payer: BLUE CROSS/BLUE SHIELD | Admitting: Psychiatry

## 2022-10-12 DIAGNOSIS — F320 Major depressive disorder, single episode, mild: Secondary | ICD-10-CM | POA: Diagnosis not present

## 2022-10-12 DIAGNOSIS — F39 Unspecified mood [affective] disorder: Secondary | ICD-10-CM

## 2022-10-12 DIAGNOSIS — F4323 Adjustment disorder with mixed anxiety and depressed mood: Secondary | ICD-10-CM | POA: Diagnosis not present

## 2022-10-12 MED ORDER — SERTRALINE HCL 100 MG PO TABS: ORAL_TABLET | ORAL | 3 refills | Status: DC

## 2022-10-12 MED ORDER — TRAZODONE HCL 100 MG PO TABS: ORAL_TABLET | ORAL | 4 refills | Status: DC

## 2022-10-12 MED ORDER — BUSPIRONE HCL 10 MG PO TABS: ORAL_TABLET | ORAL | 4 refills | Status: DC

## 2022-10-12 NOTE — Progress Notes (Signed)
Psychiatric Initial Adult Assessment   Patient Identification: Debra Cardenas MRN:  [id number] Date of Evaluation:  10/12/2022 Referral Source:  Chief Complaint: Depression Visit Diagnosis   ICD-10-CM   1. Adjustment disorder with mixed anxiety and depressed mood  F43.23 busPIRone (BUSPAR) 10 MG tablet      History of Present Illness:    This patient is a 52 year old African-American female who been under my care approximately 3 years ago.  At that time she was diagnosed with an adjustment disorder with an anxious mood state was treated with BuSpar and trazodone for sleep.  Unfortunately she lost her health insurance has not been seen since.  Today she returns with significant daily depression.  Her father died earlier this year in 05/24/2023 unexpectedly.  Her brother recently died in 07/24/23 also unexpected.  Her brother was so sick he was on a ventilator and she had to be the person to make a decision to turn it off.  Patient feel persistently depressed now for 3 months and has gotten worse.  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  Her daily depression is associated with significant insomnia.  She has difficulty going to sleep and staying asleep.  She takes naps when she is working.  Her energy level is low her self-esteem is reduced she shows significant psychomotor slowing.  She is not suicidal.  She has reduced her ability to enjoy things.  She still tries to read this in the music.  She no longer watches television.  She denies use of alcohol or drugs.  She does not have a clear history of major depression.  She has no symptoms of mania.  Medically she is fairly stable.  She does take Bupreorphrine for back pain.  Associated Signs/Symptoms: Depression Symptoms:  depressed mood, (Hypo) Manic Symptoms:   Anxiety Symptoms:  Excessive Worry, Psychotic Symptoms:   PTSD Symptoms: NA  Previous Psychotropic Medications: Yes   Substance Abuse History in the  last 12 months:  No.  Consequences of Substance Abuse: NA  Past Medical History:  Past Medical History:  Diagnosis Date   Adjustment disorder with mixed anxiety and depressed mood    Anxiety    Bipolar 1 disorder (HCC)    Chronic pain    Depression    Fibromyalgia    Hypertension    Migraine    Morbid obesity (HCC)    MRSA (methicillin resistant staph aureus) culture positive    Myalgia and myositis    Opiate dependence (HCC)    Prediabetes     Past Surgical History:  Procedure Laterality Date   ABDOMINAL HYSTERECTOMY     CESAREAN SECTION     EAR CYST EXCISION     x3   KNEE ARTHROPLASTY     TUBAL LIGATION     uterine ablasion      Family Psychiatric History:   Family History:   Social History:   Social History   Socioeconomic History   Marital status: Married    Spouse name: Not on file   Number of children: Not on file   Years of education: Not on file   Highest education level: Not on file  Occupational History   Not on file  Tobacco Use   Smoking status: Never   Smokeless tobacco: Never  Vaping Use   Vaping Use: Never used  Substance and Sexual Activity   Alcohol use: No   Drug use: No   Sexual activity: Not Currently  Birth control/protection: Surgical  Other Topics Concern   Not on file  Social History Narrative   ** Merged History Encounter **       Social Determinants of Health   Financial Resource Strain: Not on file  Food Insecurity: Not on file  Transportation Needs: Not on file  Physical Activity: Not on file  Stress: Not on file  Social Connections: Not on file    Additional Social History:   Allergies:   Allergies  Allergen Reactions   Penicillins Hives    Chest pain    Tylenol [Acetaminophen]     Massive migraine    Metabolic Disorder Labs: No results found for: "HGBA1C", "MPG" No results found for: "PROLACTIN" No results found for: "CHOL", "TRIG", "HDL", "CHOLHDL", "VLDL", "LDLCALC" No results found for:  "TSH"  Therapeutic Level Labs: No results found for: "LITHIUM" No results found for: "CBMZ" No results found for: "VALPROATE"  Current Medications: Current Outpatient Medications  Medication Sig Dispense Refill   sertraline (ZOLOFT) 100 MG tablet Half  qam  for 6 days then 1 qam 30 tablet 3   benzonatate (TESSALON) 100 MG capsule Take 1 capsule (100 mg total) by mouth every 8 (eight) hours. 21 capsule 0   busPIRone (BUSPAR) 10 MG tablet 1  bid for  1week then 2 BID 120 tablet 4   cyclobenzaprine (FLEXERIL) 10 MG tablet Take 1 tablet (10 mg total) by mouth 2 (two) times daily as needed for muscle spasms. 10 tablet 0   fluticasone (FLONASE) 50 MCG/ACT nasal spray Place 2 sprays into both nostrils daily.     gabapentin (NEURONTIN) 300 MG capsule Take 300 mg by mouth 3 (three) times daily.     hydrOXYzine (ATARAX) 25 MG tablet Take 1 tablet (25 mg total) by mouth every 6 (six) hours as needed for anxiety. 12 tablet 0   ibuprofen (ADVIL,MOTRIN) 600 MG tablet Take 1 tablet (600 mg total) by mouth every 6 (six) hours as needed. 30 tablet 0   methylPREDNISolone (MEDROL DOSEPAK) 4 MG TBPK tablet Take as directed 1 each 0   naproxen (NAPROSYN) 375 MG tablet Take 1 tablet (375 mg total) by mouth 2 (two) times daily. 20 tablet 0   oxyCODONE (OXY IR/ROXICODONE) 5 MG immediate release tablet TAKE ONE TABLET BY MOUTH TWICE A DAY AS NEEDED FOR 30 DAYS FILL DATE 12/28/2020     oxycodone (OXY-IR) 5 MG capsule Take 10 mg by mouth every 4 (four) hours as needed.     pregabalin (LYRICA) 150 MG capsule Take 150 mg by mouth 3 (three) times daily.     traZODone (DESYREL) 100 MG tablet 3  qhs 90 tablet 4   No current facility-administered medications for this visit.    Musculoskeletal: Strength & Muscle Tone: within normal limits Gait & Station: normal Patient leans: N/A  Psychiatric Specialty Exam: Review of Systems  There were no vitals taken for this visit.There is no height or weight on file to  calculate BMI.  General Appearance: Casual  Eye Contact:  Good  Speech:  Negative  Volume:  Normal  Mood:  Depressed  Affect:  Appropriate  Thought Process:  Goal Directed  Orientation:  NA  Thought Content:  WDL  Suicidal Thoughts:  No  H Create Note  Est Patient 1  My Note      Progress Notes  Psychiatry  10/12/2022 03:00 PM  ROS  Insert SmartText   omicidal Thoughts:  No  Memory:  NA  Judgement:  Good  Insight:  Good  Psychomotor Activity:  Normal  Concentration:    Recall:  Good  Fund of Knowledge:Good  Language: Good  Akathisia:  NA  Handed:  Right  AIMS (if indicated):  not done  Assets:  Desire for Improvement  ADL's:  Intact  Cognition: WNL  Sleep:  Fair   Screenings: [place] ED from 07/09/2022 in [facility] Emergency Department at [facility] ED from 01/02/2021 in [facility] Emergency Department at [place] No Risk No Risk       Assessment and Plan:   At this time this patient meets the diagnosis of major depression.  She has daily depression with disturbance of sleep appetite and energy and psychomotor slowing.  She is also lost her ability to enjoy things maximally.  Fortunately she is not psychotic.  At this time we will begin her on Zoloft and build up to 100 mg.  Her second diagnosis is adjustment disorder with an anxious mood state.  The patient will restart back on BuSpar and build up to 10 mg 2 twice daily.  Her third problem is insomnia.  The patient will begin back on trazodone 300 mg.  Fortunately the patient is in therapy at this time with a woman named Crystal .  Patient return to see me in 2 months.  Collaboration of Care:   Patient/Guardian was advised Release of Information must be obtained prior to any record release in order to collaborate their care with an outside provider. Patient/Guardian was advised if they have not already done so to contact the registration department to  sign all necessary forms in order for [place] to release information regarding their care.   Consent: Patient/Guardian gives verbal consent for treatment and assignment of benefits for services provided during this visit. Patient/Guardian expressed understanding and agreed to proceed.   Jerral Ralph, MD 1/24/20243:44 PM

## 2022-10-14 ENCOUNTER — Telehealth (HOSPITAL_COMMUNITY): Payer: Self-pay

## 2022-10-14 NOTE — Telephone Encounter (Signed)
Medication management - Telephone call back with patient, after speaking to Dr. Casimiro Needle about her sedation issues with restart of Trazodone and management of grief. Informed Dr. Casimiro Needle advised patient to lower dosage of Trazodone and to taper back up later if needed, starting back at 100 mg at night and then could go to 200 mg at night if needed.  Also, informed Dr. Casimiro Needle approved a letter to be provided by this nurse for him today requesting patient be allowed to miss work over the coming weekend with plans to return on or after Monday 10/17/22.  Informed patient the letter had been prepared and she agreed to come into the office today for pickup.  Letter left at front reception for patient to pick up per instructions by Dr. Casimiro Needle and his approval.

## 2022-10-14 NOTE — Telephone Encounter (Signed)
Medication management - Telephone call with patient as she was reporting some left over sedation with restart of Trazodone medication. Patient requested this be discussed with Dr. Casimiro Needle and also requested he approve a letter to allow her to miss work for the coming weekend as she readjust to medications and is also still dealing with grief.  Agreed to contact Dr. Casimiro Needle, who is out today and will follow up with patient after discussion with provider.

## 2022-12-05 ENCOUNTER — Encounter: Payer: Self-pay | Admitting: Orthopedic Surgery

## 2022-12-05 ENCOUNTER — Other Ambulatory Visit: Payer: Self-pay | Admitting: Orthopedic Surgery

## 2022-12-05 DIAGNOSIS — M41124 Adolescent idiopathic scoliosis, thoracic region: Secondary | ICD-10-CM

## 2022-12-05 DIAGNOSIS — M47814 Spondylosis without myelopathy or radiculopathy, thoracic region: Secondary | ICD-10-CM

## 2022-12-14 ENCOUNTER — Other Ambulatory Visit (HOSPITAL_COMMUNITY): Payer: Self-pay | Admitting: Psychiatry

## 2022-12-14 ENCOUNTER — Ambulatory Visit (HOSPITAL_BASED_OUTPATIENT_CLINIC_OR_DEPARTMENT_OTHER): Payer: BLUE CROSS/BLUE SHIELD | Admitting: Psychiatry

## 2022-12-14 DIAGNOSIS — F325 Major depressive disorder, single episode, in full remission: Secondary | ICD-10-CM

## 2022-12-14 MED ORDER — SERTRALINE HCL 100 MG PO TABS: ORAL_TABLET | ORAL | 5 refills | Status: DC

## 2022-12-14 MED ORDER — TRAZODONE HCL 100 MG PO TABS: ORAL_TABLET | ORAL | 4 refills | Status: DC

## 2022-12-14 NOTE — Progress Notes (Signed)
Psychiatric Initial Adult Assessment   Patient Identification: Debra Cardenas MRN:  [id number] Date of Evaluation:  12/14/2022 Referral Source:  Chief Complaint: Depression Visit Diagnosis No diagnosis found.   History of Present Illness:     Today the patient is seen in the office and is doing much better.  Her mood is improved.  She continues in therapy.  She started on Zoloft to 100 mg and she is doing great.  She believes that has been helpful.  She takes BuSpar 10 mg twice a day and trazodone at night.  The dose of trazodone at 300 mg might be too sedating the next day.  Patient has other issues going on and she has not been back and is in physical therapy.  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  Nonetheless she is starting to enjoy things again.  Her energy is definitely improved.  Today she was a little show good check and is on her way here she saw an accident.  Overall though the patient is doing much better.  She no longer takes naps.  Associated Signs/Symptoms: Depression Symptoms:  depressed mood, (Hypo) Manic Symptoms:   Anxiety Symptoms:  Excessive Worry, Psychotic Symptoms:   PTSD Symptoms: NA  Previous Psychotropic Medications: Yes   Substance Abuse History in the last 12 months:  No.  Consequences of Substance Abuse: NA  Past Medical History:  Past Medical History:  Diagnosis Date   Adjustment disorder with mixed anxiety and depressed mood    Anxiety    Bipolar 1 disorder (HCC)    Chronic pain    Depression    Fibromyalgia    Hypertension    Migraine    Morbid obesity (HCC)    MRSA (methicillin resistant staph aureus) culture positive    Myalgia and myositis    Opiate dependence ([place])    Prediabetes     Past Surgical History:  Procedure Laterality Date   ABDOMINAL HYSTERECTOMY     CESAREAN SECTION     EAR CYST EXCISION     x3   KNEE ARTHROPLASTY     TUBAL LIGATION     uterine ablasion      Family  Psychiatric History:   Family History:   Social History:   Social History   Socioeconomic History   Marital status: Married    Spouse name: Not on file   Number of children: Not on file   Years of education: Not on file   Highest education level: Not on file  Occupational History   Not on file  Tobacco Use   Smoking status: Never   Smokeless tobacco: Never  Vaping Use   Vaping Use: Never used  Substance and Sexual Activity   Alcohol use: No   Drug use: No   Sexual activity: Not Currently    Birth control/protection: Surgical  Other Topics Concern   Not on file  Social History Narrative   ** Merged History Encounter **       Social Determinants of Health   Financial Resource Strain: Not on file  Food Insecurity: Not on file  Transportation Needs: Not on file  Physical Activity: Not on file  Stress: Not on file  Social Connections: Not on file    Additional Social History:   Allergies:   Allergies  Allergen Reactions   Penicillins Hives    Chest pain    Tylenol [Acetaminophen]     Massive migraine    Metabolic Disorder Labs: No  results found for: "HGBA1C", "MPG" No results found for: "PROLACTIN" No results found for: "CHOL", "TRIG", "HDL", "CHOLHDL", "VLDL", "LDLCALC" No results found for: "TSH"  Therapeutic Level Labs: No results found for: "LITHIUM" No results found for: "CBMZ" No results found for: "VALPROATE"  Current Medications: Current Outpatient Medications  Medication Sig Dispense Refill   benzonatate (TESSALON) 100 MG capsule Take 1 capsule (100 mg total) by mouth every 8 (eight) hours. 21 capsule 0   busPIRone (BUSPAR) 10 MG tablet 1  bid for  1week then 2 BID 120 tablet 4   cyclobenzaprine (FLEXERIL) 10 MG tablet Take 1 tablet (10 mg total) by mouth 2 (two) times daily as needed for muscle spasms. 10 tablet 0   fluticasone (FLONASE) 50 MCG/ACT nasal spray Place 2 sprays into both nostrils daily.     gabapentin (NEURONTIN) 300 MG capsule  Take 300 mg by mouth 3 (three) times daily.     hydrOXYzine (ATARAX) 25 MG tablet Take 1 tablet (25 mg total) by mouth every 6 (six) hours as needed for anxiety. 12 tablet 0   ibuprofen (ADVIL,MOTRIN) 600 MG tablet Take 1 tablet (600 mg total) by mouth every 6 (six) hours as needed. 30 tablet 0   methylPREDNISolone (MEDROL DOSEPAK) 4 MG TBPK tablet Take as directed 1 each 0   naproxen (NAPROSYN) 375 MG tablet Take 1 tablet (375 mg total) by mouth 2 (two) times daily. 20 tablet 0   oxyCODONE (OXY IR/ROXICODONE) 5 MG immediate release tablet TAKE ONE TABLET BY MOUTH TWICE A DAY AS NEEDED FOR 30 DAYS FILL DATE 12/28/2020     oxycodone (OXY-IR) 5 MG capsule Take 10 mg by mouth every 4 (four) hours as needed.     pregabalin (LYRICA) 150 MG capsule Take 150 mg by mouth 3 (three) times daily.     sertraline (ZOLOFT) 100 MG tablet Half  qam  for 6 days then 1 qam 30 tablet 5   traZODone (DESYREL) 100 MG tablet 3  qhs 90 tablet 4   No current facility-administered medications for this visit.    Musculoskeletal: Strength & Muscle Tone: within normal limits Gait & Station: normal Patient leans: N/A  Psychiatric Specialty Exam: Review of Systems  There were no vitals taken for this visit.There is no height or weight on file to calculate BMI.  General Appearance: Casual  Eye Contact:  Good  Speech:  Negative  Volume:  Normal  Mood:  Depressed  Affect:  Appropriate  Thought Process:  Goal Directed  Orientation:  NA  Thought Content:  WDL  Suicidal Thoughts:  No  H Create Note  Est Patient 1  My Note      Progress Notes  Psychiatry  10/12/2022 03:00 PM  ROS  Insert SmartText   omicidal Thoughts:  No  Memory:  NA  Judgement:  Good  Insight:  Good  Psychomotor Activity:  Normal  Concentration:    Recall:  Good  Fund of Knowledge:Good  Language: Good  Akathisia:  NA  Handed:  Right  AIMS (if indicated):  not done  Assets:  Desire for Improvement  ADL's:  Intact   Cognition: WNL  Sleep:  Fair   Screenings: [place] ED from 07/09/2022 in [facility] ED from 01/02/2021 in [facility] Emergency Department at [place] No Risk No Risk       Assessment and Plan:     This patient's diagnosis is major depression in  remission.  She will continue taking 100 mg of Zoloft and continue seeing her therapist.  She has adjustment disorder with an anxious mood state and takes BuSpar 10 mg 1 twice daily.  She has insomnia and takes 300 mg of trazodone and does great.  She will be seen again in 3 months.  I believe she is very stable at this time. Collaboration of Care:   Patient/Guardian was advised Release of Information must be obtained prior to any record release in order to collaborate their care with an outside provider. Patient/Guardian was advised if they have not already done so to contact the registration department to sign all necessary forms in order for [place] to release information regarding their care.   Consent: Patient/Guardian gives verbal consent for treatment and assignment of benefits for services provided during this visit. Patient/Guardian expressed understanding and agreed to proceed.   Jerral Ralph, MD 3/27/20243:44 PM

## 2022-12-18 ENCOUNTER — Other Ambulatory Visit: Payer: Self-pay

## 2022-12-18 ENCOUNTER — Emergency Department (HOSPITAL_BASED_OUTPATIENT_CLINIC_OR_DEPARTMENT_OTHER): Payer: BLUE CROSS/BLUE SHIELD

## 2022-12-18 ENCOUNTER — Emergency Department (HOSPITAL_BASED_OUTPATIENT_CLINIC_OR_DEPARTMENT_OTHER)
Admission: EM | Admit: 2022-12-18 | Discharge: 2022-12-19 | Disposition: A | Discharge status: Home / Self Care | Payer: BLUE CROSS/BLUE SHIELD | Attending: Emergency Medicine | Admitting: Emergency Medicine

## 2022-12-18 DIAGNOSIS — R2243 Localized swelling, mass and lump, lower limb, bilateral: Secondary | ICD-10-CM | POA: Diagnosis not present

## 2022-12-18 DIAGNOSIS — D649 Anemia, unspecified: Secondary | ICD-10-CM | POA: Diagnosis not present

## 2022-12-18 DIAGNOSIS — R600 Localized edema: Secondary | ICD-10-CM | POA: Diagnosis not present

## 2022-12-18 LAB — CBC WITH DIFFERENTIAL/PLATELET
Abs Immature Granulocytes: 0.04 10*3/uL (ref 0.00–0.07)
Basophils Absolute: 0.1 10*3/uL (ref 0.0–0.1)
Basophils Relative: 1 %
Eosinophils Absolute: 0.1 10*3/uL (ref 0.0–0.5)
Eosinophils Relative: 1 %
HCT: 34.1 % — ABNORMAL LOW (ref 36.0–46.0)
Hemoglobin: 11 g/dL — ABNORMAL LOW (ref 12.0–15.0)
Immature Granulocytes: 0 %
Lymphocytes Relative: 25 %
Lymphs Abs: 2.4 10*3/uL (ref 0.7–4.0)
MCH: 26.7 pg (ref 26.0–34.0)
MCHC: 32.3 g/dL (ref 30.0–36.0)
MCV: 82.8 fL (ref 80.0–100.0)
Monocytes Absolute: 1.1 10*3/uL — ABNORMAL HIGH (ref 0.1–1.0)
Monocytes Relative: 11 %
Neutro Abs: 5.6 10*3/uL (ref 1.7–7.7)
Neutrophils Relative %: 62 %
Platelets: 392 10*3/uL (ref 150–400)
RBC: 4.12 MIL/uL (ref 3.87–5.11)
RDW: 16.5 % — ABNORMAL HIGH (ref 11.5–15.5)
WBC: 9.3 10*3/uL (ref 4.0–10.5)
nRBC: 0 % (ref 0.0–0.2)

## 2022-12-18 LAB — COMPREHENSIVE METABOLIC PANEL
ALT: 13 U/L (ref 0–44)
AST: 17 U/L (ref 15–41)
Albumin: 3.7 g/dL (ref 3.5–5.0)
Alkaline Phosphatase: 110 U/L (ref 38–126)
Anion gap: 8 (ref 5–15)
BUN: 12 mg/dL (ref 6–20)
CO2: 27 mmol/L (ref 22–32)
Calcium: 9 mg/dL (ref 8.9–10.3)
Chloride: 101 mmol/L (ref 98–111)
Creatinine, Ser: 0.72 mg/dL (ref 0.44–1.00)
GFR, Estimated: >60 mL/min (ref >60)
Glucose, Bld: 87 mg/dL (ref 70–99)
Potassium: 3.8 mmol/L (ref 3.5–5.1)
Sodium: 136 mmol/L (ref 135–145)
Total Bilirubin: 0.1 mg/dL — ABNORMAL LOW (ref 0.3–1.2)
Total Protein: 8.1 g/dL (ref 6.5–8.1)

## 2022-12-18 LAB — BRAIN NATRIURETIC PEPTIDE: B Natriuretic Peptide: 18.4 pg/mL (ref 0.0–100.0)

## 2022-12-18 NOTE — Discharge Instructions (Addendum)
Seen today for swelling in your legs.  Ultrasound showed no DVT, your blood work was reassuring.  You have mild anemia with hemoglobin of 11.  Follow-up with your primary care doctor for this.  There is no signs of heart failure, normal kidney and liver functions were noted on your labs.  No signs of heart failure.  Your legs elevated.  Avoid excess sodium in your diet, you can wear compressive socks as well to help.  Follow-up closely with your primary care doctor.  Back to the ER for any new or worsening symptoms.

## 2022-12-18 NOTE — ED Provider Notes (Signed)
[facility] [place] Provider Note   CSN: [id number] Arrival date & time: 12/18/22  2009     History  Chief Complaint  Patient presents with   Leg Swelling    Debra Cardenas is a 52 y.o. female.  Reports history of obesity and prediabetes.  Is ER today complaining of bilateral lower extremity swelling with right greater than left leg swelling.  Denies any pain.  States she just noticed it yesterday.  She did a virtual visit with [facility] and they told her that since her right leg was more swollen she did have DVT rule out in the right lower extremity.  She tried elevating her legs without difficulty.  Denies shortness of breath chest pain fevers chills or other complaints.  She has never had this happen in the past.  HPI     Home Medications Prior to Admission medications   Medication Sig Start Date End Date Taking? Authorizing Provider  benzonatate (TESSALON) 100 MG capsule Take 1 capsule (100 mg total) by mouth every 8 (eight) hours. 06/01/20   Eustaquio Maize, PA-C  busPIRone (BUSPAR) 10 MG tablet 1  bid for  1week then 2 BID 10/12/22   Plovsky, Berneta Sages, MD  cyclobenzaprine (FLEXERIL) 10 MG tablet Take 1 tablet (10 mg total) by mouth 2 (two) times daily as needed for muscle spasms. 04/06/20   Alfredia Client, PA-C  fluticasone (FLONASE) 50 MCG/ACT nasal spray Place 2 sprays into both nostrils daily.    [provider]  gabapentin (NEURONTIN) 300 MG capsule Take 300 mg by mouth 3 (three) times daily.    [provider]  hydrOXYzine (ATARAX) 25 MG tablet Take 1 tablet (25 mg total) by mouth every 6 (six) hours as needed for anxiety. 07/09/22   Small, Brooke L, PA  ibuprofen (ADVIL,MOTRIN) 600 MG tablet Take 1 tablet (600 mg total) by mouth every 6 (six) hours as needed. 11/06/14   Noe Gens, PA-C  methylPREDNISolone (MEDROL DOSEPAK) 4 MG TBPK tablet Take as directed 06/18/20   Garald Balding, PA-C  naproxen (NAPROSYN) 375 MG  tablet Take 1 tablet (375 mg total) by mouth 2 (two) times daily. 04/06/20   Alfredia Client, PA-C  oxyCODONE (OXY IR/ROXICODONE) 5 MG immediate release tablet TAKE ONE TABLET BY MOUTH TWICE A DAY AS NEEDED FOR 30 DAYS FILL DATE 12/28/2020 12/29/20   [provider]  oxycodone (OXY-IR) 5 MG capsule Take 10 mg by mouth every 4 (four) hours as needed.    [provider]  pregabalin (LYRICA) 150 MG capsule Take 150 mg by mouth 3 (three) times daily.    [provider]  sertraline (ZOLOFT) 100 MG tablet Half  qam  for 6 days then 1 qam 12/14/22   Plovsky, Berneta Sages, MD  traZODone (DESYREL) 100 MG tablet 3  qhs 12/14/22   Plovsky, Berneta Sages, MD      Allergies    Penicillins and Tylenol [acetaminophen]    Review of Systems   Review of Systems  Physical Exam Updated Vital Signs BP (!) 141/69 (BP Location: Right Arm)   Pulse 95   Temp 99 F (37.2 C) (Oral)   Resp 18   SpO2 93%  Physical Exam Vitals and nursing note reviewed.  Constitutional:      General: She is not in acute distress.    Appearance: She is well-developed.  HENT:     Head: Normocephalic and atraumatic.  Eyes:     Conjunctiva/sclera: Conjunctivae normal.  Cardiovascular:  Rate and Rhythm: Normal rate and regular rhythm.     Pulses:          Dorsalis pedis pulses are 2+ on the right side and 2+ on the left side.       Posterior tibial pulses are 2+ on the right side and 2+ on the left side.     Heart sounds: No murmur heard. Pulmonary:     Effort: Pulmonary effort is normal. No respiratory distress.     Breath sounds: Normal breath sounds.  Abdominal:     Palpations: Abdomen is soft.     Tenderness: There is no abdominal tenderness.  Musculoskeletal:        General: No swelling.     Cervical back: Neck supple.     Right lower leg: 1+ Edema present.     Left lower leg: 1+ Edema present.  Skin:    General: Skin is warm and dry.     Capillary Refill: Capillary refill takes less than 2 seconds.   Neurological:     General: No focal deficit present.     Mental Status: She is alert and oriented to person, place, and time.  Psychiatric:        Mood and Affect: Mood normal.     ED Results / Procedures / Treatments   Labs (all labs ordered are listed, but only abnormal results are displayed) Labs Reviewed  CBC WITH DIFFERENTIAL/PLATELET - Abnormal; Notable for the following components:      Result Value   Hemoglobin 11.0 (*)    HCT 34.1 (*)    RDW 16.5 (*)    Monocytes Absolute 1.1 (*)    All other components within normal limits  COMPREHENSIVE METABOLIC PANEL - Abnormal; Notable for the following components:   Total Bilirubin 0.1 (*)    All other components within normal limits  BRAIN NATRIURETIC PEPTIDE    EKG None  Radiology US Venous Img Lower Unilateral Right  Result Date: 12/18/2022 CLINICAL DATA:  Right lower extremity swelling EXAM: RIGHT LOWER EXTREMITY VENOUS DOPPLER ULTRASOUND TECHNIQUE: Gray-scale sonography with compression, as well as color and duplex ultrasound, were performed to evaluate the deep venous system(s) from the level of the common femoral vein through the popliteal and proximal calf veins. COMPARISON:  None Available. FINDINGS: VENOUS Normal compressibility of the common femoral, superficial femoral, and popliteal veins, as well as the visualized calf veins. Visualized portions of profunda femoral vein and great saphenous vein unremarkable. No filling defects to suggest DVT on grayscale or color Doppler imaging. Doppler waveforms show normal direction of venous flow, normal respiratory plasticity and response to augmentation. Limited views of the contralateral common femoral vein are unremarkable. OTHER None. Limitations: none IMPRESSION: Negative. Electronically Signed   By: Rolm Baptise M.D.   On: 12/18/2022 21:13    Procedures Procedures    Medications Ordered in ED Medications - No data to display  ED Course/ Medical Decision Making/ A&P                              Medical Decision Making This patient presents to the ED for concern of bilateral lower extremity swelling today, this involves an extensive number of treatment options, and is a complaint that carries with it a high risk of complications and morbidity.  The differential diagnosis includes DVT, CHF, dependent edema, other  Co morbidities that complicate the patient evaluation  Obesity, prediabetes   Additional history obtained:  Additional history obtained from EMR External records from outside source obtained and reviewed including old PT notes for sacroiliac dysfunction and chronic pain   Lab Tests:  I Ordered, and personally interpreted labs.  The pertinent results include: CBC revealed mild anemia, CMP and BNP are normal.   Imaging Studies ordered:  DVT study right lower extremity shows no DVT per radiology.     Problem List / ED Course / Critical interventions / Medication management  Patient presents with lower extremity swelling and feels right lower extremity is more swollen than the left, sent in for DVT rule out for right lower extremity swelling by [facility] provider.  DVT study was negative.  I do not appreciate a significant difference in the swelling, she does have some mild edema noted in her ankles where her socks have made a small indent in her skin.  Her exam is otherwise unremarkable.  There is no cellulitis, no calf tenderness, lung auscultation is clear.  Due to patient's concerns we did obtain labs to rule out CHF with a BNP, though clinical suspicion is low patient does have risk factor with her severe obesity.  Normal renal and liver function on her CMP noted, she is very mildly anemic but no other acute abnormalities.  Counseled on compression stockings, elevation, avoiding sodium rich foods.  Able to ambulate without difficulty.  No shortness of breath, or chest pains or other associated symptoms     Amount and/or Complexity of  Data Reviewed Labs: ordered.           Final Clinical Impression(s) / ED Diagnoses Final diagnoses:  Peripheral edema    Rx / DC Orders ED Discharge Orders     None         Gwenevere Abbot, PA-C 12/19/22 0000    Tegeler, Gwenyth Allegra, MD 12/19/22 0001

## 2022-12-18 NOTE — ED Triage Notes (Signed)
Pt here for bilateral ankle swelling that she first noticed yesterday, R ankle and foot is more swollen than the L. Pt reports 8/10 pain, has tried voltaren gel and elevating her legs w/o relief.

## 2023-03-22 ENCOUNTER — Ambulatory Visit (HOSPITAL_BASED_OUTPATIENT_CLINIC_OR_DEPARTMENT_OTHER): Payer: BLUE CROSS/BLUE SHIELD | Admitting: Psychiatry

## 2023-03-22 DIAGNOSIS — F4323 Adjustment disorder with mixed anxiety and depressed mood: Secondary | ICD-10-CM | POA: Diagnosis not present

## 2023-03-22 DIAGNOSIS — F325 Major depressive disorder, single episode, in full remission: Secondary | ICD-10-CM | POA: Diagnosis not present

## 2023-03-22 MED ORDER — TRAZODONE HCL 100 MG PO TABS: ORAL_TABLET | ORAL | 4 refills | Status: DC

## 2023-03-22 MED ORDER — BUSPIRONE HCL 10 MG PO TABS
ORAL_TABLET | ORAL | 4 refills | Status: AC
Start: 2023-03-22 — End: ?

## 2023-03-22 MED ORDER — SERTRALINE HCL 100 MG PO TABS: ORAL_TABLET | ORAL | 5 refills | Status: AC
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
## 2023-06-28 ENCOUNTER — Ambulatory Visit (HOSPITAL_COMMUNITY): Payer: BLUE CROSS/BLUE SHIELD | Admitting: Psychiatry

## 2024-01-17 ENCOUNTER — Ambulatory Visit (HOSPITAL_COMMUNITY): Admitting: Psychiatry

## 2024-01-23 ENCOUNTER — Ambulatory Visit (HOSPITAL_COMMUNITY): Admitting: Psychiatry

## 2024-06-04 ENCOUNTER — Ambulatory Visit (HOSPITAL_BASED_OUTPATIENT_CLINIC_OR_DEPARTMENT_OTHER): Admitting: Psychiatry

## 2024-06-04 ENCOUNTER — Other Ambulatory Visit: Payer: Self-pay

## 2024-06-04 ENCOUNTER — Encounter (HOSPITAL_COMMUNITY): Payer: Self-pay | Admitting: Psychiatry

## 2024-06-04 VITALS — BP 131/82 | HR 83 | Ht 67.0 in | Wt 254.0 lb

## 2024-06-04 DIAGNOSIS — F324 Major depressive disorder, single episode, in partial remission: Secondary | ICD-10-CM | POA: Diagnosis not present

## 2024-06-04 MED ORDER — TRAZODONE HCL 100 MG PO TABS: ORAL_TABLET | ORAL | 4 refills | Status: DC

## 2024-06-04 NOTE — Progress Notes (Signed)
 Psychiatric Initial Adult Assessment   Patient Identification: Debra Cardenas  MRN:  [id number] Date of Evaluation:  06/04/2024 Referral Source:  Chief Complaint: Depression Visit Diagnosis No diagnosis found.    History of Present Illness:      Today the patient is seen in the office.  She has not been seen in well over a year.  Her last 2 appointments she missed.  She was no-show.  Technically there is a question whether or not she should be seen again.  But since she is here and she has expectations that she will be seen we went ahead and saw her.  The patient is noted to be talking very rapidly.  She really denies being persistently depressed she describes being somewhat irritable at times more than usual.  She is sleeping very poorly.  She says she does not need much sleep only 3 or 4 hours.  Presently the patient lives with her Down syndrome daughter who is in her 30s.  Patient has never been in a psychiatric hospital as a patient.  Since I have seen her she has had gastric bypass surgery.  The patient does describe in the past she had some problems with the Klonopin.  She has been getting her medicines from our office with the expectations that she had a return appointment.  She actually has been off of all her psychiatric medicines for over 6 months.  She has a hard time describing what exactly is different other than the fact that she is not sleeping and that she has a lot of energy.  Today the patient received an abbreviated evaluation.  It took us  a while to clarify if we should see her at all.  But we chose to see her.  She is very friendly and delightful and I actually do remember her from the past.  Associated Signs/Symptoms: Depression Symptoms:  depressed mood, (Hypo) Manic Symptoms:   Anxiety Symptoms:  Excessive Worry, Psychotic Symptoms:   PTSD Symptoms: NA  Previous Psychotropic Medications: Yes   Substance Abuse History in the last 12 months:  No.  Consequences  of Substance Abuse: NA  Past Medical History:  Past Medical History:  Diagnosis Date   Adjustment disorder with mixed anxiety and depressed mood    Anxiety    Bipolar 1 disorder (HCC)    Chronic pain    Depression    Fibromyalgia    Hypertension    Migraine    Morbid obesity (HCC)    MRSA (methicillin resistant staph aureus) culture positive    Myalgia and myositis    Opiate dependence (HCC)    Prediabetes     Past Surgical History:  Procedure Laterality Date   ABDOMINAL HYSTERECTOMY     CESAREAN SECTION     EAR CYST EXCISION     x3   KNEE ARTHROPLASTY     TUBAL LIGATION     uterine ablasion      Family Psychiatric History:   Family History:   Social History:   Social History   Socioeconomic History   Marital status: Married    Spouse name: Not on file   Number of children: Not on file   Years of education: Not on file   Highest education level: Not on file  Occupational History   Not on file  Tobacco Use   Smoking status: Never   Smokeless tobacco: Never  Vaping Use   Vaping status: Never Used  Substance and Sexual Activity   Alcohol use: No  Drug use: No   Sexual activity: Not Currently    Birth control/protection: Surgical  Other Topics Concern   Not on file  Social History Narrative   ** Merged History Encounter **       Social Drivers of Health   Financial Resource Strain: Not on file  Food Insecurity: High Risk (06/03/2024)   Received from [facility]   Hunger Vital Sign    Within the past 12 months, you worried that your food would run out before you got money to buy more: Sometimes true    Within the past 12 months, the food you bought just didn't last and you didn't have money to get more. : Often true  Transportation Needs: No Transportation Needs (06/03/2024)   Received from [facility]    In the past 12 months, has lack of reliable transportation kept you from medical appointments, meetings, work or from getting  things needed for daily living? : No  Physical Activity: Not on file  Stress: Not on file  Social Connections: Unknown (10/10/2022)   Received from [facility]   Social Network    Social Network: Not on file    Additional Social History:   Allergies:   Allergies  Allergen Reactions   Penicillins Hives    Chest pain    Tylenol [Acetaminophen]     Massive migraine    Metabolic Disorder Labs: No results found for: HGBA1C, MPG No results found for: PROLACTIN No results found for: CHOL, TRIG, HDL, CHOLHDL, VLDL, LDLCALC No results found for: TSH  Therapeutic Level Labs: No results found for: LITHIUM No results found for: CBMZ No results found for: VALPROATE  Current Medications: Current Outpatient Medications  Medication Sig Dispense Refill   benzonatate  (TESSALON ) 100 MG capsule Take 1 capsule (100 mg total) by mouth every 8 (eight) hours. 21 capsule 0   busPIRone  (BUSPAR ) 10 MG tablet 1  bid for  1week then 2 BID 120 tablet 4   cyclobenzaprine  (FLEXERIL ) 10 MG tablet Take 1 tablet (10 mg total) by mouth 2 (two) times daily as needed for muscle spasms. 10 tablet 0   fluticasone (FLONASE) 50 MCG/ACT nasal spray Place 2 sprays into both nostrils daily.     gabapentin (NEURONTIN) 300 MG capsule Take 300 mg by mouth 3 (three) times daily.     hydrOXYzine  (ATARAX ) 25 MG tablet Take 1 tablet (25 mg total) by mouth every 6 (six) hours as needed for anxiety. 12 tablet 0   ibuprofen  (ADVIL ,MOTRIN ) 600 MG tablet Take 1 tablet (600 mg total) by mouth every 6 (six) hours as needed. 30 tablet 0   methylPREDNISolone  (MEDROL  DOSEPAK) 4 MG TBPK tablet Take as directed 1 each 0   naproxen  (NAPROSYN ) 375 MG tablet Take 1 tablet (375 mg total) by mouth 2 (two) times daily. 20 tablet 0   oxyCODONE  (OXY IR/ROXICODONE ) 5 MG immediate release tablet TAKE ONE TABLET BY MOUTH TWICE A DAY AS NEEDED FOR 30 DAYS FILL DATE 12/28/2020     oxycodone  (OXY-IR) 5 MG capsule Take 10  mg by mouth every 4 (four) hours as needed.     pregabalin (LYRICA) 150 MG capsule Take 150 mg by mouth 3 (three) times daily.     sertraline  (ZOLOFT ) 100 MG tablet Half  qam  for 6 days then 1 qam 30 tablet 5   traZODone  (DESYREL ) 100 MG tablet 3  qhs 90 tablet 4   No current facility-administered medications for this visit.  Musculoskeletal: Strength & Muscle Tone: within normal limits Gait & Station: normal Patient leans: N/A  Psychiatric Specialty Exam: Review of Systems  Blood pressure 131/82, pulse 83, height 5' 7 (1.702 m), weight 254 lb (115.2 kg).Body mass index is 39.78 kg/m.  General Appearance: Casual  Eye Contact:  Good  Speech:  Negative  Volume:  Normal  Mood:  Depressed  Affect:  Appropriate  Thought Process:  Goal Directed  Orientation:  NA  Thought Content:  WDL  Suicidal Thoughts:  No  H Create Note  Est Patient 1  My Note      Progress Notes  Psychiatry  10/12/2022 03:00 PM  ROS  Insert SmartText   omicidal Thoughts:  No  Memory:  NA  Judgement:  Good  Insight:  Good  Psychomotor Activity:  Normal  Concentration:    Recall:  Good  Fund of Knowledge:Good  Language: Good  Akathisia:  NA  Handed:  Right  AIMS (if indicated):  not done  Assets:  Desire for Improvement  ADL's:  Intact  Cognition: WNL  Sleep:  Fair   Screenings: Flowsheet Row ED from 12/18/2022 in [facility] Emergency Department at [facility] ED from 07/09/2022 in [facility] Emergency Department at [facility] ED from 01/02/2021 in [facility] Emergency Department at [facility]  C-SSRS RISK CATEGORY No Risk No Risk No Risk    Assessment and Plan:    This patient has a past history of major depression.  Typically she was maintained on Zoloft  trazodone  and BuSpar .  Today she hardly describes much of a depressive state but more almost of a manic state.  I do not believe she is dangerous to herself or anyone else and she is functioning  fairly well.  Today we will start off by treating her with trazodone  300 mg at night which is the dose she was taking before.  I will see her back.  In 3 to 4 weeks hopefully getting a good night of sleep.  Will reassess her at that time.  It should be noted that she has actually been very stable for the last year and a half and has not needed any psychiatric assistance.  She says she is back because she notices that she is more withdrawn does not seem to be enjoying things as much.  So there are some equivocal evidence of a depression disorder but the patient hardly describes being sad or down or low.  It is a bit complicated and we will try to figure it out on her next visit.  The patient is not suicidal.  And she continues to function.  Patient/Guardian was advised Release of Information must be obtained prior to any record release in order to collaborate their care with an outside provider. Patient/Guardian was advised if they have not already done so to contact the registration department to sign all necessary forms in order for us  to release information regarding their care.   Consent: Patient/Guardian gives verbal consent for treatment and assignment of benefits for services provided during this visit. Patient/Guardian expressed understanding and agreed to proceed.   Elna LILLETTE Lo, MD 9/16/20252:55 PM

## 2024-07-03 ENCOUNTER — Other Ambulatory Visit: Payer: Self-pay

## 2024-07-03 ENCOUNTER — Ambulatory Visit (HOSPITAL_BASED_OUTPATIENT_CLINIC_OR_DEPARTMENT_OTHER): Admitting: Psychiatry

## 2024-07-03 VITALS — BP 141/94 | HR 81 | Ht 67.0 in | Wt 243.0 lb

## 2024-07-03 DIAGNOSIS — F341 Dysthymic disorder: Secondary | ICD-10-CM | POA: Diagnosis not present

## 2024-07-03 MED ORDER — TRAZODONE HCL 100 MG PO TABS
ORAL_TABLET | ORAL | 3 refills | Status: AC
Start: 2024-07-03 — End: ?

## 2024-07-03 MED ORDER — DULOXETINE HCL 20 MG PO CPEP: ORAL_CAPSULE | ORAL | 4 refills | Status: DC

## 2024-07-03 NOTE — Progress Notes (Signed)
 Psychiatric Initial Adult Assessment   Patient Identification: Debra Cardenas  MRN:  [id number] Date of Evaluation:  07/03/2024 Referral Source:  Chief Complaint: Depression Visit Diagnosis No diagnosis found.    History of Present Illness:    Today the patient is seen in the office.  She was seen a few weeks ago and was put on trazodone  300 mg with minimal effectiveness.  The patient says her mind is always active and this is the way it always been.  Interestingly she had a Sadi gastric surgery which essentially limits her gastric area.  This occurred in the last few months.  So is not surprising that her trazodone  at the present dose is not working.  The patient is not clear about her depression state.  She does state that she feels more withdrawn and isolated.  She feels that she cannot get going.  She has no dysfunctional depression.  She does feel somewhat sad but it is episodic.  Her energy level is described as low.  She is sleeping poorly.  Her appetite is normal.  Ability to think and concentrate is fairly good.  She denies worthlessness.  She denies any symptoms of mania.  The patient does enjoy the television has 3 dogs and loves music.  The patient does have an appetite.  She has upcoming knee surgery in the next few weeks.  Patient's biggest complaint seems to be pain.  Associated Signs/Symptoms: Depression Symptoms:  depressed mood, (Hypo) Manic Symptoms:   Anxiety Symptoms:  Excessive Worry, Psychotic Symptoms:   PTSD Symptoms: NA  Previous Psychotropic Medications: Yes   Substance Abuse History in the last 12 months:  No.  Consequences of Substance Abuse: NA  Past Medical History:  Past Medical History:  Diagnosis Date   Adjustment disorder with mixed anxiety and depressed mood    Anxiety    Bipolar 1 disorder (HCC)    Chronic pain    Depression    Fibromyalgia    Hypertension    Migraine    Morbid obesity (HCC)    MRSA (methicillin resistant staph  aureus) culture positive    Myalgia and myositis    Opiate dependence (HCC)    Prediabetes     Past Surgical History:  Procedure Laterality Date   ABDOMINAL HYSTERECTOMY     CESAREAN SECTION     EAR CYST EXCISION     x3   KNEE ARTHROPLASTY     TUBAL LIGATION     uterine ablasion      Family Psychiatric History:   Family History:   Social History:   Social History   Socioeconomic History   Marital status: Married    Spouse name: Not on file   Number of children: Not on file   Years of education: Not on file   Highest education level: Not on file  Occupational History   Not on file  Tobacco Use   Smoking status: Never   Smokeless tobacco: Never  Vaping Use   Vaping status: Never Used  Substance and Sexual Activity   Alcohol use: No   Drug use: No   Sexual activity: Not Currently    Birth control/protection: Surgical  Other Topics Concern   Not on file  Social History Narrative   ** Merged History Encounter **       Social Drivers of Health   Financial Resource Strain: Not on file  Food Insecurity: High Risk (06/03/2024)   Received from [facility]   Hunger Vital Sign  Within the past 12 months, you worried that your food would run out before you got money to buy more: Sometimes true    Within the past 12 months, the food you bought just didn't last and you didn't have money to get more. : Often true  Transportation Needs: No Transportation Needs (06/03/2024)   Received from [facility]    In the past 12 months, has lack of reliable transportation kept you from medical appointments, meetings, work or from getting things needed for daily living? : No  Physical Activity: Not on file  Stress: Not on file  Social Connections: Unknown (10/10/2022)   [facility] from [facility]   Social Network    Social Network: Not on file    Additional Social History:   Allergies:   Allergies  Allergen Reactions   Penicillins Hives    Chest pain     Tylenol [Acetaminophen]     Massive migraine    Metabolic Disorder Labs: No results found for: HGBA1C, MPG No results found for: PROLACTIN No results found for: CHOL, TRIG, HDL, CHOLHDL, VLDL, LDLCALC No results found for: TSH  Therapeutic Level Labs: No results found for: LITHIUM No results found for: CBMZ No results found for: VALPROATE  Current Medications: Current Outpatient Medications  Medication Sig Dispense Refill   benzonatate  (TESSALON ) 100 MG capsule Take 1 capsule (100 mg total) by mouth every 8 (eight) hours. 21 capsule 0   busPIRone  (BUSPAR ) 10 MG tablet 1  bid for  1week then 2 BID 120 tablet 4   cyclobenzaprine  (FLEXERIL ) 10 MG tablet Take 1 tablet (10 mg total) by mouth 2 (two) times daily as needed for muscle spasms. 10 tablet 0   DULoxetine (CYMBALTA) 20 MG capsule 1 qam 30 capsule 4   fluticasone (FLONASE) 50 MCG/ACT nasal spray Place 2 sprays into both nostrils daily.     gabapentin (NEURONTIN) 300 MG capsule Take 300 mg by mouth 3 (three) times daily.     hydrOXYzine  (ATARAX ) 25 MG tablet Take 1 tablet (25 mg total) by mouth every 6 (six) hours as needed for anxiety. 12 tablet 0   ibuprofen  (ADVIL ,MOTRIN ) 600 MG tablet Take 1 tablet (600 mg total) by mouth every 6 (six) hours as needed. 30 tablet 0   methylPREDNISolone  (MEDROL  DOSEPAK) 4 MG TBPK tablet Take as directed 1 each 0   naproxen  (NAPROSYN ) 375 MG tablet Take 1 tablet (375 mg total) by mouth 2 (two) times daily. 20 tablet 0   oxyCODONE  (OXY IR/ROXICODONE ) 5 MG immediate release tablet TAKE ONE TABLET BY MOUTH TWICE A DAY AS NEEDED FOR 30 DAYS FILL DATE 12/28/2020     oxycodone  (OXY-IR) 5 MG capsule Take 10 mg by mouth every 4 (four) hours as needed.     pregabalin (LYRICA) 150 MG capsule Take 150 mg by mouth 3 (three) times daily.     sertraline  (ZOLOFT ) 100 MG tablet Half  qam  for 6 days then 1 qam 30 tablet 5   traZODone  (DESYREL ) 100 MG tablet 3  qhs 120 tablet 3   No  current facility-administered medications for this visit.    Musculoskeletal: Strength & Muscle Tone: within normal limits Gait & Station: normal Patient leans: N/A  Psychiatric Specialty Exam: Review of Systems  Blood pressure (!) 141/94, pulse 81, height 5' 7 (1.702 m), weight 243 lb (110.2 kg).Body mass index is 38.06 kg/m.  General Appearance: Casual  Eye Contact:  Good  Speech:  Negative  Volume:  Normal  Mood:  Depressed  Affect:  Appropriate  Thought Process:  Goal Directed  Orientation:  NA  Thought Content:  WDL  Suicidal Thoughts:  No  H Create Note  Est Patient 1  My Note      Progress Notes  Psychiatry  10/12/2022 03:00 PM  ROS  Insert SmartText   omicidal Thoughts:  No  Memory:  NA  Judgement:  Good  Insight:  Good  Psychomotor Activity:  Normal  Concentration:    Recall:  Good  Fund of Knowledge:Good  Language: Good  Akathisia:  NA  Handed:  Right  AIMS (if indicated):  not done  Assets:  Desire for Improvement  ADL's:  Intact  Cognition: WNL  Sleep:  Fair   Screenings: Flowsheet Row ED from 12/18/2022 in [facility] Emergency Department at [facility] ED from 07/09/2022 in [facility] Department at [facility] ED from 01/02/2021 in [facility] Emergency Department at [facility]  C-SSRS RISK CATEGORY No Risk No Risk No Risk    Assessment and Plan:     This patient's diagnosis is still somewhat not clear.  He does have dysphoria and she is withdrawn and somewhat isolated.  But her symptom is are not episodic.  They seem to be somewhat persistent.  At this time my interventions is to give her Cymbalta at a relatively low dose given her gastric surgery.  It is my hope that it has some effect on her depression and that it reduces her pain.  Will also increase her trazodone  to a higher dose of 400 mg.  Patient had a list of therapist to see as well.  This patient to return to see me in 2 months.  At that  time show had her knee surgery.  Patient/Guardian was advised Release of Information must be obtained prior to any record release in order to collaborate their care with an outside provider. Patient/Guardian was advised if they have not already done so to contact the registration department to sign all necessary forms in order for us  to release information regarding their care.   Consent: Patient/Guardian gives verbal consent for treatment and assignment of benefits for services provided during this visit. Patient/Guardian expressed understanding and agreed to proceed.   Elna LILLETTE Lo, MD 10/15/20253:34 PM

## 2024-07-08 NOTE — H&P (View-Only) (Signed)
 Patient Name: Debra Cardenas  MR#: [id number] Author: Jordan Miller Case, MD    Subjective:   CC: Bilateral knee pain   HPI: Debra Cardenas  is a 53 y.o. female presenting to clinic today for bilateral knee pain.  She has a history of osteoarthritis in both knees, right greater than left.  She has been undergoing treatment for her knee osteoarthritis for the last 10 years.  She has received steroid injections as well as viscosupplementation in the past.  She underwent Visco injections in October 2024 which she reports did not offer as much benefit as she was hoping.  She really would like to know what her options are in terms of dealing with the knee pain she does not feel injections are as beneficial as they were in the past.  Last set of steroid injections was about a year ago and lasted about 3 months.  She did undergo weight loss surgery in January 2025 and has lost some weight since then.    HPI     Pain    Additional comments: Room 4. Pt presents today for H&P regarding her Right Knee. Swelling and pain has increased.       Last edited by Carlin DELENA Moons, CMA on 07/08/2024  1:47 PM.       --- Interval History  July 03, 2024: Patient returns to office today to discuss her right greater than left knee pain.  We did do an injection at her last visit but she reports this only provided about a month of relief.  At this point she has not lost the weight needed to be a candidate for total knee replacement.  Her BMI is currently 39.11.  She would like to proceed with scheduling total knee arthroplasty.  July 08, 2024:  Pt presents today for H&P regarding her Right Knee. Pt is doing a nerve block procedure on Thursday for her lumbar spine. Pt reports allergic reaction from EKG leads.  ---  Objective:   Physical Exam:  Bilateral Knee: no effusion Full extension 130 degrees of flexion Tenderness to palpation of medial joint line Tenderness to palpation of lateral joint  line No instability of collateral or cruciate ligaments McMurray's test negative     __ Diagnostic Studies/Labs:  Xrays of the bilateral knees taken on 02/26/24 independently reviewed and interpreted by me show tricompartmental degenerative changes worse at patellofemoral and medial compartments.  __ Assessment:  53 y.o. female with primary osteoarthritis bilateral knees   Discussion:  We discussed that her clinical and radiographic findings of the right knee show degenerative changes particularly at the patellofemoral and medial compartments. Given her severity of pain and functional limitations, we discussed that she would likely be a candidate for surgery. The surgery that would be performed is right total knee arthroplasty.  The risks and benefits of the surgical procedure were discussed with the patient which include but are not limited to infection, bleeding, loss of function, failure of the procedure, stroke, heart attack, or other catistrophic event.  The patient voiced their understanding and wishes to proceed.  Follow up for post op.   __ Plan of care:   R TKA Brace Follow up for post op.  __ MDM:  Medical Decision Making Elements:  Number/Complexity of Problems: 1 or chronic illnesses with exacerbation Risk of Complications/morbidity: moderate, decision for surgery.  Amount/Complexity of Data: low    This document serves as a record of services personally performed by Jordan Case, MD. It was created on their behalf  by , Morgan Gee  a trained medical scribe. The creation of this record is the provider's dictation and/or activities during the visit.   Electronically signed by: Morgan Gee, Scribe 07/08/2024 8:30 AM  I agree the documentation is accurate and complete.  Electronically signed by: Jordan Case, MD 07/08/2024 8:30 AM

## 2024-07-15 NOTE — Anesthesia Preprocedure Evaluation (Addendum)
 Patient: Debra Cardenas   Procedure Information     Date/Time: 07/23/24 0715   Procedure: ARTHROPLASTY TOTAL KNEE REPLACEMENT ROBOTIC (Right)   Location: [facility] OR 04 / [facility] OR   Surgeons: Jordan Miller Case, MD       Relevant Problems  CARDIOVASCULAR  (+) Essential hypertension  (+) Migraine with aura    NEUROLOGY  (+) Migraine with aura    RESPIRATORY SYSTEM  (+) Mild intermittent asthma without complication (CMD)    Other  (+) Primary osteoarthritis involving multiple joints  (+) Primary osteoarthritis of right knee    Ht 1.676 m (5' 6)   Wt 110 kg (242 lb)   BMI 39.06 kg/m    Clinical information reviewed:  Tobacco  Allergies  Meds  Med Hx  Surg Hx  OB Status  Fam Hx  Soc  Hx     Anesthesia Evaluation  PONV Predictive Score (Scale 0-5):  Apfel risk score: 0 Respiratory: Patient has sleep apnea and asthma.  Cardiovascular: Patient has high blood pressure.  Neurological: Patient has headaches.  Other: Patient has obesity.     Physical Exam  Airway  Mallampati: II Neck ROM: full ROM Cardiovascular - normal exam Rhythm: regular Rate: normal Dental - normal exam Pulmonary - normal exam   Anesthesia Plan  Review Preop documentation reviewed: H&P reviewed, NPO Status Reviewed, Preop Vitals Reviewed, Previous Anesthesia Records Reviewed and Periop Tests and Results Reviewed  Plan ASA score: 3  Anesthesia type: regional Induction: intravenous Post-op plan: PACU  Postoperative Plan Regional/Neuroaxial: adductor canal block  Informed Consent Anesthetic plan and risks discussed with patient. Plan discussed with attending.

## 2024-07-22 NOTE — Progress Notes (Signed)
 Location Information: Patient State (at time of visit): [place]  Patient Location (at time of visit):Home/Other Non-Medical  Provider Location: Home Is provider licensed to provide clinical care in the current location/state of the patient? Yes   Consent:  Patient's identity was confirmed. Presenting condition or illness was discussed with the patient/personal representative. Current proposed treatment for presenting condition or illness was explained to patient/personal representative along with the likely benefits and any significant risks or complications associated with the provision of treatment by audio/video means. The patient/personal representative verbally authorized treatment to be provided by audio/video, which may include a limited review of patient's current health status, medication, or other treatment recommendations, patient education, and an opportunity to ask questions about condition and treatment. Verbal Consent Granted by Patient/Personal Representative:Yes   Visit Information: Modality: 2-Way Real-Time Audio/Video  Video Total Time: 30 minutes  Procedure: Biliopancreatic Diversion with Duodenal Switch  on 10/17/23  Surgeon: Dr. Winfred Vitals:      Todays REPORTED weight: 227.5 lbs (Carium 11/3) Highest weight: 317 lbs Total weight Loss: 89.5 lbs Excess weight: 181.8 lbs Percent excess weight loss: 49 % Goal Weight: 171-189  Assessment:   Total Estimated protein (grams): 70+ (protein goal = 70-80) Total Estimated fluid (ounces): 64 fl oz water plus 12 oz decaf coffee mixing with protein   Food Intolerances: Yes rice, salty foods taste extra salty to her now Nausea:  No  Vomiting:  No Diarrhea:  No Constipation:  No Any hair loss:  Yes  Dining Out: 0-1 times per week Eat protein 1st:  Yes Drinks with meals: No  Vitamin Regimen: Multivitamin: Yes Calcium Citrate: Yes Iron :  Yes Vitamin B12:  Yes  Other supplements: none  A typical day  includes:  S: Coffee + protein B: slice of turkey bacon with egg and slice keto bread S: apple, sometimes with peanut butter L: tacos no shell S: -- D: 2-3 ounces chicken with asparagus or other veggie S: --  Consumes 3 meals and 1-2 snacks per day  Beverages include: water, shakes, decaf coffee Daily caffeine intake: 0 oz  Sugar sweetened beverages: 0 oz Alcoholic Beverages:0 oz Avoids carbonated beverages: Yes Total Daily Fluid intake is Adequate    Medications:  Current Medications[1]  Physical Activity: some but limited d/t knee issues  Information reviewed: Postop portions  Lab Orders Placed: none  Comments: [name]  is 9 months postop and reports that things are going great from a nutrition standpoint. She is very happy with her weight loss and success so far. Reports meeting her protein and fluid goals daily, reported intake appears to be appropriate. Reports taking appropriate vitamins and following the 30 rule. She is scheduled to have a total knee replacement done this month. Discussed importance of meeting protein while healing from this. Will send her list of frozen entree options and healthy dining out handout as quick options when healing from surgery. Encouraged her to follow up if needing additional nutrition support at that time. Will f/u with RD at 1 year postop, encouraged her to keep up the great work.   Recommendations:   Goals: Goals listed below under recommendations   Goals: Protein: 70-80 grams Fluids: 64-80 fl oz  Attached are some frozen entrees and healthier options when dining out I have also attached the high protein snack list for some more quick options  Electronically signed by: Jeanine Kennedy  07/22/2024 1:36 PM   Time spent with patient: 30 minutes        [1]  Current Outpatient Medications  Medication Sig Dispense Refill  . diclofenac sodium (VOLTAREN) 1 % gel Apply 2 g topically 3 (three) times a day as needed  (pain). 100 g 0  . DULoxetine (CYMBALTA) 20 mg capsule every morning.    . hydroCHLOROthiazide (HYDRODIURIL) 12.5 mg capsule Take 1 capsule (12.5 mg total) by mouth daily as needed (leg swelling). 90 capsule 1  . ipratropium (ATROVENT ) 21 mcg (0.03 %) nasal spray Administer 2 sprays into each nostril every 12 (twelve) hours.    . lidocaine (XYLOCAINE) 5 % ointment Apply topically 2 (two) times a day. 30 g 5  . methocarbamoL (ROBAXIN) 500 mg tablet Take 1 tablet (500 mg total) by mouth 2 (two) times a day as needed for muscle spasms. 60 tablet 2  . MISCELLANEOUS MEDICATION/PRODUCT Cash powder- Patient to bring to next appointment with Dr. Gilmer to be applied 1 applicator 3  . phentermine 15 mg cap Take 1 capsule (15 mg total) by mouth daily. 30 capsule 2  . pregabalin (LYRICA) 100 mg capsule Take 1 capsule (100 mg total) by mouth daily AND 2 capsules (200 mg total) nightly. 90 capsule 1  . tiZANidine (ZANAFLEX) 4 mg tablet Take 4 mg by mouth nightly.     No current facility-administered medications for this visit.

## 2024-07-23 ENCOUNTER — Ambulatory Visit (HOSPITAL_COMMUNITY): Admitting: Psychiatry

## 2024-07-23 NOTE — Anesthesia Postprocedure Evaluation (Signed)
 Patient: Debra Cardenas   Procedure Summary     Date: 07/23/24 Room / Location: [facility] OR 04 / [facility] OR   Anesthesia Start: 0711 Anesthesia Stop: 0910   Procedure: ARTHROPLASTY TOTAL KNEE REPLACEMENT ROBOTIC (Right: Knee) Diagnosis:      Primary osteoarthritis of right knee     (Primary osteoarthritis of right knee [M17.11])   Surgeons: Jordan Miller Case, MD Responsible Provider: Charlie Jayson An, DO   Anesthesia Type: regional ASA Status: 3       Anesthesia Type: regional  Vitals Value Taken Time  BP 117/72 07/23/24 10:15  Temp 97.2 F (36.2 C) 07/23/24 10:15  Pulse 68 07/23/24 10:18  Resp 14 07/23/24 10:18  SpO2 98 % 07/23/24 10:18  Vitals shown include unfiled device data.  There were no known notable events for this encounter.  Anesthesia Post Evaluation  Final anesthesia type: regional Patient location during evaluation: Bedside Patient participation: complete - patient participated Level of consciousness: awake and alert Pain score: pain well controlled (patient comfortable/resting) Pain management: adequate Post-op vital signs: post-procedure vital signs are stable Patient temperature: Normothermic Cardiovascular status: acceptable and hemodynamically stable Respiratory status: acceptable Hydration status: acceptable Post-op disposition: Inpatient Floor Anesthesia post-op complications?:no complications

## 2024-07-23 NOTE — Anesthesia Procedure Notes (Signed)
 Peripheral Block  Patient location during procedure: pre-op Start time: 07/23/2024 7:03 AM End time: 07/23/2024 7:08 AM Reason for block: post-op pain management Block Referral: at surgeon's request Requested by: Jordan Miller Case, MD Staffing Performed: anesthesiologist  Anesthesiologist: Rutha Patt Farr, MD Preanesthetic Checklist Completed: patient identified, IV checked, site marked, risks and benefits discussed, monitors and equipment checked, pre-op evaluation, timeout performed, anesthesia consent given, NPO status verified and anticoagulation status verified Peripheral Block Patient position: supine Prep: ChloraPrep Patient monitoring: heart rate, blood pressure, cardiac monitor and continuous pulse ox Patient sedation: Yes Asepsis: Sterile Gloves, Hat/Mask and Sterile U/S Sheath Block Type Location: lower extremity block Lower Extremity Block Type: adductor canal nerve block Laterality: right Number of attempts: 1 Injection technique: single-shot Local infiltration: lidocaine Needle Needle type: Stimuplex  Needle gauge: 20 G Needle length: 10 cm Technique: ultrasound guided, ultrasound permanent image saved, Ultrasound was used to visualize spread of anesthetic in close proximity to the nerve(s) being blocked and The nerve(s) appeared normal and there were no apparent abnormal pathologic findings Ultrasound Image Saved: yes Approach: in plane Assessment Procedure Evaluation: complete Injection assessment: negative aspiration for heme, no paresthesia on injection, incremental injection and local visualized surrounding nerve on ultrasound Heart rate change: no Slow fractionated injection: yes Patient tolerated procedure well with no complications Additional Notes Ultrasound guidance was used to locate the patient's superficial femoral artery. 30 ml of local was injected anterior to the artery. Aspiration was negative for blood every 5 ml, and the ultrasound provided  visualization of the artery and vein. There were no noted complications and the patient tolerated the procedure well.    Image retained in the electronic medical record.

## 2024-07-23 NOTE — Interval H&P Note (Signed)
 The surgical history has been reviewed and remains accurate without interval change.  The patient was re-examined and patient's physiologic condition has not changed significantly in the last 30 days. The condition still exists that makes this procedure necessary. The treatment plan remains the same, without new options for care.  No new pharmacological allergies or types of therapy has been initiated that would change the plan or the appropriateness of the plan.  The patient and/or family understand the potential benefits and risks.  The patient's PMHx/PSHx/ROS/all/meds/socHx/famHx were reviewed. The patient remains appropriate for outpatient surgery.

## 2024-07-23 NOTE — Unmapped External Note (Signed)
 Acute OT Evaluation Co-Evaluation with Physical therapy  Precautions: Weight Bearing Status: Yes RLE: WBAT Other Therapy Precautions: Fall risk   Medical Diagnosis/Course: OT Diagnosis/Course Pertinent Medical Course: Patient admitted on 11/3 due osteoarthritis of the right knee. Patient underwent total knee arthroplasty by Dr. Case.   OT Treatment Diagnosis:    Impaired mobility and ADLs   ASSESSMENT SUMMARY   A Moderate Complexity Occupational Therapy evaluation was completed on this date in conjunction with physical therapy due to medical complexity, patient safety, interdisciplinary care coordination, and appropriate discharge planning. Patient is a 53 y.o. female admitted to [facility] 07/23/2024 for above noted medical diagnosis/course. Please see below for past medical history. Functional level prior to admission: Patient reports independence in ADLs, IADLs, medication management, mobility, and driving..   Currently, patient is requiring CGA for LB dressing, transfers, toileting, and ambulation using RW, close supervision for bed mobility, independent for UB dressing, and close supervision for grooming. See below in ADLs and functional mobility for further details. Patient presents with deficits noted below in the problem list section that impact patient's participation in ADLs and functional transfers. Patient is currently functioning near baseline with ADLs and functional transfers when compared to prior to admission status.  Patient presents with AM PAC score of 24 indicating 0% impairment in activities of daily living per AM [facility] 6-Click Daily Activity Conversion Table. No further skilled occupational therapy needs in the acute setting noted at this time as all needs addressed at evaluation and patient issued appropriate educational handouts. OT to sign off.  Problem List:  Rehab Potential Rehab Potential: Good Complexity/Co-morbidities that Impact POC: Arthritis, Cardiac compromise,  Depression/Anxiety, Pulmonary compromise Impairments/Limitations: Activity Tolerance Deficits, Balance Deficits, Basic Activity of Daily living deficits, Mobility deficits, Safety Awareness deficits, Pain limiting function  OT Recommendation: OT Recommendation: Home with intermittent assistance, No skilled OT needs at discharge OT Equipment Recommended: Bedside commode  Home Living Environment: Type of Home House  Home Layout One level  Exterior Stairs - number 1 STe through side door- no rails  Shower/Tub Tub/Shower Unit  Bathroom Equipment Hand-held shower, Raised toilet height (Sink near by toilet; Has suction grab bar in toilet)  Home Equipment Walker-rolling, Cane-single point (walking stick, crutches)  Equipment Currently Using None  Additional Comments Patient reports independence in ADLs, IADLs, medication management, mobility, and driving.   Prior Level of Function:  Level of Independence Independent  Lives With Daughter  Person(s) able to assist at d/c Daughter and sister will provide 24/7 assist  Patient Responsibilities  Driving, Home management, Manage Finances, Manage Medications, Meal Preparation, Personal ADLs   Fall History: Fall in the last year?: Yes Fall/Injury Details: No injury Feel unsteady or off balance?: Yes (sometimes when knee buckles)  PERTINENT MEDICAL INFORMATION   Past Medical History: Medical History[1]  Past Surgical History: Surgical History[2]  SUBJECTIVE  Consent for therapy provided by: Nurse  Assessed in the Presence of: Family and PT Copy Communication: Interdisciplinary Team Communication Communication: Patient, Clinical Case Management, Nursing, PT  Subjective: Sometimes I feel off balance when I knee buckles.  Patient goal: to return home  OBJECTIVE   Appearance: Lines/Leads: bed/chair exit alarm, external catheter, Ice Man, saline lock, and SCDs  Pain: Pain Assessment: 0-10 Pain Score  :  (Patient  reports 11/10 pain, increasing to 20/10. RN aware and provided all pain medication as able and reached out to provider.) Pain Intervention(s): Repositioned, Elevated, Rest, Emotional support, Ambulation/Increased activity  Cognition:  Cognition Orientation Level: Oriented x4  Overall Cognitive Status: Within Functional Limits Executive Functioning: Intact   Vision/Perception and Hearing  Current Vision:  (Contacts vs Rx glasses) Vision Screen: Within Functional Limits Perception: Within Functional Limits Hearing: Within Functional Limits   Communication: WFL  ROM:  ROM Assessment Overall ROM Assessment: Bilateral UEs WFL  Strength:  Strength Assessment Overall Strength Assessment: Bilateral UEs WFL  Skin Integrity and Edema:  Skin: Intact except surgical incision(s) Edema: Other (comment) Part of Body: post op RLE edema   Sensation:  Sensation Light Touch: No apparent deficits Parasthesia: Denies Numbness: Denies  Coordination:  Hand Dominance: Right  Coordination Additional Comments: BUE WFL   ADLs: ADLs Toilet Transfers - To: BSC, Toilet (BSC frame over low commode) Assistive Device: Financial Controller - Level of Assistance: Geophysicist/field Seismologist (STS at [facility] with cues for RLE positioning to reduce pain and safe)  Dressing: Education was provided regarding surgeons protocol for using TED hose. Verbal education was provided including tips for donning/doffing with increased ease. OT recommended donning pants over the surgical leg first. Education to don underwear and pants over feet first while seated then stand to pull both up and the same time to avoid needing to stand twice. Patient required CGA to thread BLEs into underwear and pants and pull over hips in standing. Educated for appropriate footwear. Also recommended completing upper body dressing in a seated position for safety. Patient independent to don shirt and br seated EOB.  Showering:  Recommended patient have approval from surgeon prior to showering and also have initial supervision/assist for safety. Education provided for use of 3:1 in the shower if it will fit verses use of a shower chair. Education for use of long handle sponge to clean lower extremities if unable to reach. Recommend patient have non-skid mat inside and outside shower to increase safety. Education provided for basic incisional care during showering including avoidance of scrubbing or submersion. Recommended pt dry off as much as possible prior to getting out of the shower.  Toileting: Education provided for use of 3:1 over the commode in order to increase ease with toilet transfer. Therapist demonstrated how to determine accurate height of 3:1. Patient able to complete standing peri-care and clothing management with CGA and cues for RLE positioning, and standing vs seated peri-care.   Grooming: Education provided for safe positioning of RW and proximity to sink when completing grooming tasks. Close supervision to complete standing hand hygiene with cues for safe RW placement.  Education provided for safety with completion of ADL/IADLs. Recommended having family support as much as possible. Handout provided with tips for safe reintroduction to ADL/IADLs. Patient assistance level for further ADLs determined by clinical judgement, balance, UE ROM and strength assessment. See AM-PAC score below.  Balance:  Sitting - Static: Independent Sitting - Dynamic: Independent Standing - Static: Contact Guard Assistance Standing - Dynamic: Chiropodist Additional Comments: Standing balance assessed with RW  Bed Mobility and Transfers: Supine to Sit: Close supervision, Extra time Bed Mobility Additional Comments: Mod I to scoot anteriorly EOB using BUEs  Assistive Device: Walker-rolling Sit to Stand: [place], Verbal Cues (from EOB and recliner chair to RW with cues for safe hand  plavcement and positioning of RLE) Stand to Sit: [place], Verbal Cues (to sit to recliner chair on 2 attempts with cues for safe hand placement and positioning of RLE to reduce pain) Toilet Transfers - To: BSC, Toilet (BSC frame over low commode) Toilet Transfers - Level of Assistance: Contact  Guard Assistance (STS at [facility] with cues for RLE positioning to reduce pain and safe)  Gait Distance (ft): 100 ft Gait Assistance: Scientist, Physiological Device: Walker-rolling  Condition After Therapy: Condition After Therapy: Up in chair, Lines intact, Nursing notified of condition, Family present in room, All needs within reach, Fall interventions in place, Alarm activated  INTERVENTIONS   Skilled OT Treatment Provided  ADL/IADL training: See ADL section   Skill Performed by Clinician: Education to improve safety with functional tasks  Facilitation for proper positioning/movement in preparation for functional tasks  Facilitation for safe movement and performance of functional tasks  Monitoring exercise/activity tolerance  Monitoring safe progression of exercise/activity  Tactile cues for correction of atypical postures or movements  Tactile cues for safe execution of functional tasks  Verbal cues for pacing of functional tasks  Verbal cues for proper technique  Verbal cues for safe execution of functional tasks   Response to Interventions: Improved Activity tolerance , Improved ADL performance , Improved Balance , Improved Functional Mobility , Improved Safety Awareness , Improved strength , Increased fatigue , Pain increased , and Required therapeutic rest breaks   PERFORMANCE OUTCOME MEASURES  AM-PAC - Daily Activity:    Flowsheet Row Most Recent Value  AM-PAC 6-Clicks - Daily Activity  Lower Body Clothing Activity None  Bathing None  Toileting None  Upper Body Clothing Activity None  Personal Grooming None  Eating Meals None  AM-PAC Total Score 24     OT PLAN OF CARE   Rehab Potential: Rehab Potential Rehab Potential: Good Complexity/Co-morbidities that Impact POC: Arthritis, Cardiac compromise, Depression/Anxiety, Pulmonary compromise Impairments/Limitations: Activity Tolerance Deficits, Balance Deficits, Basic Activity of Daily living deficits, Mobility deficits, Safety Awareness deficits, Pain limiting function  Plan: Plan Patient and Family Goals: to return home Treatment Plan/Goals Established with Patient/Caregiver: Yes Planned Treatment/Interventions: Basic activities of daily living, Pain management, Energy Conservation training, Equipment training, Patient/Caregiver education, Functional Mobility training, Therapeutic activities OT Frequency: One time visit OT Duration: Discharge  OT Goals: Encounter Problems     Encounter Problems (Resolved)     Occupational Therapy     Patient will perform lower body dressing with contact guard assistance (Completed)     Start:  07/23/24    Expected End:  07/24/24    Resolved:  07/23/24   11/4- CGA (TSJ)- Goal met          TREATMENT TIME   Time In: 1317 Time Out: 1429 Time in Timed codes: 25 Total Treatment Time: 47 OT Eval Moderate Complexity minutes: 22 (Co-Evaluation with Physical Therapy; 25 minute PT charge)   Treatment/Procedures Time Entry Self Care/Home Management Training minutes: 25   Charges           07/23/2024   Code Description Service Provider Modifiers Quantity  [id number] Hc Ot Self-Care/Home Mgmt Training Each 15 Minutes [place], OT/L GO 2  [id number] Hc Ot Occupational Therapy Eval Mod Complex 45 Mins [place], OT/L GO 1        Time of Service Note Type Status  None                [1] Past Medical History: Diagnosis Date  . Abnormal thyroid function test 07/14/2022   TSH mildly overactive at 0.39.  We will plan to recheck thyroid function panel in approximately 3 weeks (~08/04/22).  . Allergic 1996    Been a long time don't remember exact date  . Anemia 2023   Check results from Natalie  Smith  . Asthma   . Bipolar affective disorder    (CMD)   . Cholelithiasis   . Chronic pain syndrome   . Depressive disorder   . Episodic mood disorder   . Esophageal reflux   . Essential hypertension   . Generalized anxiety disorder   . HL (hearing loss) 1996   Initially both ears now just right ear  . Migraine with aura   . Morbid obesity (CMD)   . Myofascial pain syndrome   . Prediabetes 07/14/2022   New diagnosis for patient.  Hemoglobin A1c 5.7%.  Healthy diet and regular exercise encouraged.  . Primary osteoarthritis involving multiple joints   . Sleep apnea   . Visual impairment Long time   Wear glasses dont know what age i started wearing them  [2] Past Surgical History: Procedure Laterality Date  . ABDOMINAL SURGERY      Procedure: ABDOMINAL SURGERY  . CESAREAN SECTION, CLASSICAL  1996   Birth of second daughter  . CESAREAN SECTION, UNSPECIFIED     Procedure: CESAREAN SECTION  . GASTROPLASTY DUODENAL SWITCH N/A 10/17/2023   SWITCH DUODENAL LAPAROSCOPIC-patient having Single Anastomosis Duodenal Switch-CPT code 56340, LAPAROSCOPIC CHOLECYSTECTOMY performed by Wilfred Winfred Raddle., MD at [facility] OR  . HYSTERECTOMY      Procedure: HYSTERECTOMY  . INNER EAR SURGERY     Procedure: INNER EAR SURGERY; x3  . KNEE SURGERY     Procedure: KNEE SURGERY  . LEG SURGERY     Procedure: LEG SURGERY  . PARTIAL HYSTERECTOMY  2013-2014   Check gyn files  . SINUS SURGERY     Procedure: SINUS SURGERY  . TUBAL LIGATION     Procedure: TUBAL LIGATION

## 2024-07-23 NOTE — Discharge Summary (Signed)
 Orthopedic Surgery Discharge Summary  Patient ID: Debra Cardenas  [id number] 53 y.o. Jun 14, 1971  Admit date: 07/23/2024 Admitting Physician: Jordan Miller Case, MD Admission Diagnoses:  Primary osteoarthritis of right knee [M17.11]     Discharge date and time: 07/23/2024   Discharge Physician: Jordan Miller Case, MD Discharge Diagnoses:  Principal Problem:   Primary osteoarthritis of right knee Active Problems:   Impaired mobility and ADLs Resolved Problems:   * No resolved hospital problems. *    Operative Procedures:  Date of Surgery: 07/23/2024   Pre-op Diagnosis:     * Primary osteoarthritis of right knee [M17.11]   Postoperative Diagnosis:       * Primary osteoarthritis of right knee [M17.11]   Procedure(s): ARTHROPLASTY TOTAL KNEE REPLACEMENT ROBOTIC   Surgeon(s): Jordan Miller Case, MD  Indications for Operative Procedures: 53 y.o. female  with end-stage severe degenerative arthritis.  Failed conservative treatment.  Risks benefits and options to the above procedure were discussed with the patient and patient wished to proceed.  Hospital Course:   53 y.o. female presented to the OR on the above named dates for the above named procedures.  Patient tolerated the procedure without difficulty or complication  and was transferred to the floor for postoperative management, prophylactic antibiotics, pain control, therapy, and discharge planning.  The patient's pain was well controlled with the prescribed pain regimen.  On the day of discharge, the patient is verbalizing adequate relief with the use of the prescribed pain regimen.  The patient has been able to void without difficulty.  The patient has been able to tolerate their diet.  The patient is with an aquacel dressing to their operative extremity that is clean, dry, and intact.  Therapy recommended  which was coordinated by nursing case manager. DVT prophylaxis initiated with early ambulation, TED hose, SCDs, . The  patient is verbalizing the desire to be discharged.  Discharged to home. Lovenox for 2 weeks followed by 6 weeks 81mg  ASA BID for DVT prophylaxis along with pain medicine to pt pharmacy of preference.  At the time of discharge, the patient was afebrile, vital signs were stable, in no acute distress.  The patient meets all goals necessary for discharge from a medical, surgical, and therapy standpoint, and thus the patient is stable and safe for discharge.  New discharge medications discussed in detail and the patient stated understanding of use and administration.  The patient is verbalizing understanding of all discharge instructions and therefore was released.     Discharge exam:  -General:  No acute distress.  Well nourished.  Appears to be stated age. -HEENT:  Normocephalic, atraumatic. -Respiratory:  Normal respiratory effort.  No wheezing.  No shortness of breath. -Heme / Lymph:  No lymphadenopathy noted in the lower extremities. -Psych:  Alert and Oriented X 4.  Normal affect. -Cardio/Vascular:  Extremities warm and well perfused with brisk cap refill. -Skin:  No ulcerations or rashes - Ortho: Operative extremity neurovascularly intact.  Dressing clean dry intact.  Aquacel with bleeding which does not reach boarder.   Procedures performed during hospitalization: @ENCPROC @  Surgical operations performed during hospitalization: Procedure(s) (LRB): ARTHROPLASTY TOTAL KNEE REPLACEMENT ROBOTIC (Right)   Consults:  @ENCORDCONSULTS @  Disposition: Home or Self Care  Patient Instructions:    Medication List     START taking these medications    aspirin 81 mg chewable tablet Chew 1 tablet (81 mg total) 2 (two) times a day.   enoxaparin 40 mg/0.4 mL Syrg Commonly known as: LOVENOX Inject  0.4 mL (40 mg total) under the skin daily for 14 days.   oxyCODONE  5 mg immediate release tablet Commonly known as: ROXICODONE  Take 1 tablet (5 mg total) by mouth every 4 (four) hours as needed  for moderate pain (4-6).       CONTINUE taking these medications    diclofenac sodium 1 % gel Commonly known as: VOLTAREN Apply 2 g topically 3 (three) times a day as needed (pain).   DULoxetine 20 mg capsule Commonly known as: CYMBALTA every morning.   hydroCHLOROthiazide 12.5 mg capsule Commonly known as: HYDRODIURIL Take 1 capsule (12.5 mg total) by mouth daily as needed (leg swelling).   ipratropium 21 mcg (0.03 %) nasal spray Commonly known as: ATROVENT  Administer 2 sprays into each nostril every 12 (twelve) hours.   lidocaine 5 % ointment Commonly known as: XYLOCAINE Apply topically 2 (two) times a day.   methocarbamoL 500 mg tablet Commonly known as: ROBAXIN Take 1 tablet (500 mg total) by mouth 2 (two) times a day as needed for muscle spasms.   MISCELLANEOUS MEDICATION/PRODUCT Cash powder- Patient to bring to next appointment with Dr. Gilmer to be applied   phentermine 15 mg Cap Take 1 capsule (15 mg total) by mouth daily.   pregabalin 100 mg capsule Commonly known as: LYRICA Take 1 capsule (100 mg total) by mouth daily AND 2 capsules (200 mg total) nightly.   tiZANidine 4 mg tablet Commonly known as: ZANAFLEX Take 4 mg by mouth nightly.   Vitamin B-12 5,000 mcg/mL Drop Generic drug: cyanocobalamin (vitamin B-12) Place under the tongue.   WOMENS MULTIVITAMIN HI POTENCY ORAL Take by mouth.         Where to Get Your Medications     These medications were sent to [facility] [address] - [place], [place] - [address] - PHONE: [phone] - FAX: [phone]  [address], [address], [place] [address]    Phone: [phone]  aspirin 81 mg chewable tablet enoxaparin 40 mg/0.4 mL Syrg oxyCODONE  5 mg immediate release tablet      Activity: activity as tolerated, no heavy lifting, pushing, pulling with the implant side for 2 months, and no driving while on analgesics Diet: regular  diet Wound Care: keep wound clean and dry Weight Bearing Status: at tolerated  Please contact your doctor or seek medical assistance if you experience any of the following:  1. Fever greater than 101.5 F. 2. Decrease in urinary output. 3. Increased warmth, swelling, redness, or pain at your incision/wound site. 4. Increased drainage or bad odor at your incision/wound site.  5. Nausea/vomiting that does not stop.  6. Numbness, tingling, or discoloration of extremity.  7. Unable to drink fluids.  8. Uncontrollable pain.  9. Any other concerning symptoms.   Please, call 911 or go to your nearest emergency room if you experience any of the following:  1. Change in speech, vision, or ability to walk 2. Chest pain.  3. Difficulty breathing or shortness of breath.  Follow-up:  Future Appointments  Date Time Provider Department Center  08/07/2024 10:00 AM Annah Fey Sebastian RIGGERS [facility]  08/07/2024  1:00 PM Shelly Charity, MD [facility] PN PRE [facility]  08/07/2024  2:15 PM Danielle Claudene Oxendine, PT [facility] PT PRE [facility]  08/09/2024 10:30 AM Danielle Claudene Mate, PT [facility] PT PRE [facility]  08/12/2024  3:00 PM Danielle Claudene Mate, PT [facility] PT  PRE [facility]  08/14/2024 11:00 AM Danielle Claudene Oxendine, PT [facility] PT PRE [facility]  08/20/2024  1:15 PM Danielle Claudene Oxendine, PT [facility] PT PRE [facility]  08/22/2024  1:15 PM Danielle Claudene Oxendine, PT [facility] PT PRE [facility]  08/23/2024 11:20 AM [facility] NURSE [facility] [place]  08/27/2024  2:45 PM Danielle Claudene Oxendine, PT [facility] PT PRE [facility] Premier  08/29/2024  1:15 PM Danielle Claudene Mate, PT [facility] PT PRE [facility]  09/03/2024  2:45 PM Danielle Claudene Oxendine, PT [facility] PT PRE [facility]  09/04/2024  1:10 PM Jordan Miller Case, MD [facility]  09/05/2024  1:30 PM Megan Rosaline Ross, PTA [facility] PT PRE [facility] Premier  09/10/2024  1:15 PM Danielle Claudene Oxendine, PT [facility] PT PRE [facility]  Premier  09/16/2024  2:15 PM Danielle Claudene Oxendine, PT [facility] PT PRE [facility] Premier  09/18/2024  2:15 PM Danielle Claudene Oxendine, PT [facility] PT PRE [facility] Premier  09/24/2024  1:15 PM Danielle Claudene Oxendine, PT [facility] PT PRE [facility] Premier  09/24/2024  3:00 PM Karol Myron Bohr, PhD [facility] WMT CC WFB CC WS  09/26/2024  1:15 PM Danielle Claudene Oxendine, PT [facility] PT PRE [facility] Premier  10/01/2024  1:15 PM Danielle Claudene Mate, PT [facility] PT PRE [facility]  10/03/2024  1:15 PM Danielle Claudene Mate, PT [facility] PT PRE [facility] Premier  10/10/2024 10:20 AM Thersia Earnie Pizza, MD [facility] PAL [facility] [address]  10/14/2024 10:00 AM Tammi Olene Petrin, PA-C [facility] [address]  10/21/2024  1:00 PM Asencion Berber, RD [facility] NUT CC WFB CC WS  10/23/2024 10:30 AM Annah Randie Hummer, PA-C [facility] Premier  01/03/2025 10:00 AM Tammi Olene Petrin, PA-C [facility] [address]    Annah Randie Hummer, PA-C

## 2024-07-23 NOTE — Anesthesia Procedure Notes (Signed)
 Spinal Block  Patient location during procedure: OR Start time: 07/23/2024 7:18 AM End time: 07/23/2024 7:19 AM Reason for block: anesthesia Staffing Performed: anesthesiologist  Anesthesiologist: Charlie Jayson An, DO Preanesthetic Checklist Completed: patient identified, IV checked, risks and benefits discussed, monitors and equipment checked, pre-op evaluation, timeout performed, anesthesia consent given, NPO status verified and Anticoagulation status verified Spinal Block Sedation: No - awake Asepsis: Drape and Hat/Mask Patient position: sitting Prep: ChloraPrep Patient monitoring: heart rate, blood pressure, cardiac monitor and continuous pulse ox Approach: midline Location: L4-5 Number of attempts: 1 Block Type: lumbar plexus Injection technique: single-shot Supplies: Spinal Tray Needle Needle type: Whitacre  Needle gauge: 22 G Needle length: 5 in Assessment Sensory level: T10 Procedure Evaluation: complete Procedure assessment: patient tolerated procedure well with no immediate complications

## 2024-07-23 NOTE — Progress Notes (Signed)
 Case Management Update  Date: 07/23/2024   Time: 7:49 AM   Patient Type: Observation  Patient admitted for:   Arthroplasty of RightKnee   Plan:   -monitoring VS, labs, surgical dressing -medication adjustments -pain management -PT, OT evals pending   HH-PT pre-arranged w/[facility]     Case Management Coordination Status: Coordination In-Progress    Anticipated Discharge Location: Home with Home Health          Suzen MARLA Null, RN  BSN, Care Manager, Office #: [phone], Mobile #: [phone]

## 2024-07-24 NOTE — Telephone Encounter (Signed)
 Joint Replacement Telephone Follow-Up  Symia Herdt [place]  53 y.o.   Discharge Date: 07/23/24 SDD--ASA 3   Surgery Surgery Type: Right Knee Surgery Date: 07/23/24  Pain Do you feel your pain is adequately controlled?: No Patient's resting pain level (0-10): 10 Patient's active pain level (0-10): 10 Pain Medication: Oxycodone , Tylenol, Lyrica Patient is taking pain meds as prescribed without adverse reaction?: yes Reminded to allow 48-72 business hours to process medication refill requests?: yes (KNEE) Reinforced use of Iceman cold therapy device to knee continuously except when walking or attending physical therapy.: yes Reminded not to use cold therapy directly on bare skin.: yes Reminded to elevate the entire leg in a straight position with knee above heart level as needed to reduce edema.: yes (For Knees Only) Reminded not to use pillow under knee only but to elevate entire leg.: yes  Bowels Are your bowels moving normally?: Yes  Anticoagulant Medication: Lovenox Taking anticoagulant as prescribed without adverse reaction: yes Using TED hose?: Yes Patient denies any chest pain, shortness of breath, or cough.: yes Patient denies pain, redness, heat, or swelling in either calf.: yes Reviewed signs and symptoms of DVT/PE and instructed to seek medical attention if any symptoms arise.: yes  Physical Therapy Where is physical therapy scheduled: home health PT-Comment: [facility] h[facility]pt is scheduled to start working with patient tomorrow Assistive devices currently utilizing.: walker  Incision Aquacel dressing intact: yes Visible drainage: no Fever greater than 101.5: no Aquacel removal date: 08/07/24      Post-op Call Information Patient reminded of first post op visit: Yes Post-op call complete?: Yes  Satisfaction How satisfied were you with your stay at the hospital/facility? (Example: nurse's professionalism, food, cleanliness, friendliness of staff): Very good Are  you satisfied with the Rehab (PT/OT) care you received?: Yes Are you comfortable with your discharge plan as explained by your Care Team?: Yes Were you satisfied with your pain management while in the hospital?: Yes Did we adequately teach you to manage your pain at home?: Yes   Patient states she is doing well overall.   Patient reminded to call our office [phone] or [phone]) anytime with questions or concerns; instructed that if calling after hours, the on-call provider will return their call.     Suzen Myron, RN July 24, 2024 3:26 PM

## 2024-08-06 ENCOUNTER — Encounter (HOSPITAL_BASED_OUTPATIENT_CLINIC_OR_DEPARTMENT_OTHER): Payer: Self-pay

## 2024-08-06 ENCOUNTER — Other Ambulatory Visit: Payer: Self-pay

## 2024-08-06 ENCOUNTER — Emergency Department (HOSPITAL_BASED_OUTPATIENT_CLINIC_OR_DEPARTMENT_OTHER)

## 2024-08-06 ENCOUNTER — Emergency Department (HOSPITAL_BASED_OUTPATIENT_CLINIC_OR_DEPARTMENT_OTHER)
Admission: EM | Admit: 2024-08-06 | Discharge: 2024-08-06 | Disposition: A | Discharge status: Home / Self Care | Attending: Emergency Medicine | Admitting: Emergency Medicine

## 2024-08-06 DIAGNOSIS — R7303 Prediabetes: Secondary | ICD-10-CM | POA: Diagnosis not present

## 2024-08-06 DIAGNOSIS — M79604 Pain in right leg: Secondary | ICD-10-CM | POA: Insufficient documentation

## 2024-08-06 DIAGNOSIS — M25561 Pain in right knee: Secondary | ICD-10-CM | POA: Insufficient documentation

## 2024-08-06 DIAGNOSIS — I10 Essential (primary) hypertension: Secondary | ICD-10-CM | POA: Insufficient documentation

## 2024-08-06 DIAGNOSIS — Z79899 Other long term (current) drug therapy: Secondary | ICD-10-CM | POA: Insufficient documentation

## 2024-08-06 DIAGNOSIS — M7989 Other specified soft tissue disorders: Secondary | ICD-10-CM | POA: Diagnosis present

## 2024-08-06 DIAGNOSIS — D649 Anemia, unspecified: Secondary | ICD-10-CM | POA: Diagnosis not present

## 2024-08-06 LAB — CBC WITH DIFFERENTIAL/PLATELET
Abs Immature Granulocytes: 0.02 K/uL (ref 0.00–0.07)
Basophils Absolute: 0 K/uL (ref 0.0–0.1)
Basophils Relative: 0 %
Eosinophils Absolute: 0 K/uL (ref 0.0–0.5)
Eosinophils Relative: 0 %
HCT: 29 % — ABNORMAL LOW (ref 36.0–46.0)
Hemoglobin: 9.5 g/dL — ABNORMAL LOW (ref 12.0–15.0)
Immature Granulocytes: 0 %
Lymphocytes Relative: 26 %
Lymphs Abs: 2.5 K/uL (ref 0.7–4.0)
MCH: 27.4 pg (ref 26.0–34.0)
MCHC: 32.8 g/dL (ref 30.0–36.0)
MCV: 83.6 fL (ref 80.0–100.0)
Monocytes Absolute: 1 K/uL (ref 0.1–1.0)
Monocytes Relative: 11 %
Neutro Abs: 5.8 K/uL (ref 1.7–7.7)
Neutrophils Relative %: 63 %
Platelets: 535 K/uL — ABNORMAL HIGH (ref 150–400)
RBC: 3.47 MIL/uL — ABNORMAL LOW (ref 3.87–5.11)
RDW: 16.8 % — ABNORMAL HIGH (ref 11.5–15.5)
WBC: 9.4 K/uL (ref 4.0–10.5)
nRBC: 0 % (ref 0.0–0.2)

## 2024-08-06 LAB — BASIC METABOLIC PANEL WITH GFR
Anion gap: 11 (ref 5–15)
BUN: 8 mg/dL (ref 6–20)
CO2: 24 mmol/L (ref 22–32)
Calcium: 9.2 mg/dL (ref 8.9–10.3)
Chloride: 104 mmol/L (ref 98–111)
Creatinine, Ser: 0.72 mg/dL (ref 0.44–1.00)
GFR, Estimated: >60 mL/min (ref >60)
Glucose, Bld: 97 mg/dL (ref 70–99)
Potassium: 3.3 mmol/L — ABNORMAL LOW (ref 3.5–5.1)
Sodium: 139 mmol/L (ref 135–145)

## 2024-08-06 MED ORDER — FENTANYL CITRATE (PF) 50 MCG/ML IJ SOSY
50.0000 ug | PREFILLED_SYRINGE | Freq: Once | INTRAMUSCULAR | Status: AC
  Administered 2024-08-06: 50 ug via INTRAMUSCULAR
  Filled 2024-08-06: qty 1

## 2024-08-06 NOTE — ED Triage Notes (Addendum)
 Reports right knee & leg pain and swelling x 1 week that worsened today. Reports knee replacement surgery on 11/04. Denies cp/SOB

## 2024-08-06 NOTE — ED Provider Notes (Signed)
 [place] [facility] Provider Note   CSN: [id number] Arrival date & time: 08/06/24  1536     Patient presents with: Leg Swelling   Debra Cardenas  is a 53 y.o. female.   53 year old female presents today for concern of right leg and knee pain and swelling.  Has been ongoing for about a week.  Recently status post total knee replacement on the right.  Has been self injecting enoxaparin for DVT prophylaxis.  Has been working with physical therapy.  No prior history of DVT or PE.  No chest pain, or shortness of breath.  Denies any purulent drainage from the incision site.  Does notice some warmth. Denies fever.  Has been ambulatory using her walker.  The history is provided by the patient. No language interpreter was used.       Prior to Admission medications   Medication Sig Start Date End Date Taking? Authorizing Provider  benzonatate  (TESSALON ) 100 MG capsule Take 1 capsule (100 mg total) by mouth every 8 (eight) hours. 06/01/20   Venter, Margaux, PA-C  busPIRone  (BUSPAR ) 10 MG tablet 1  bid for  1week then 2 BID 03/22/23   Plovsky, Elna, MD  cyclobenzaprine  (FLEXERIL ) 10 MG tablet Take 1 tablet (10 mg total) by mouth 2 (two) times daily as needed for muscle spasms. 04/06/20   Patel, Shalyn, PA-C  DULoxetine (CYMBALTA) 20 MG capsule 1 qam 07/03/24   Plovsky, Elna, MD  fluticasone (FLONASE) 50 MCG/ACT nasal spray Place 2 sprays into both nostrils daily.    [provider]  gabapentin (NEURONTIN) 300 MG capsule Take 300 mg by mouth 3 (three) times daily.    [provider]  hydrOXYzine  (ATARAX ) 25 MG tablet Take 1 tablet (25 mg total) by mouth every 6 (six) hours as needed for anxiety. 07/09/22   Small, Brooke L, PA  ibuprofen  (ADVIL ,MOTRIN ) 600 MG tablet Take 1 tablet (600 mg total) by mouth every 6 (six) hours as needed. 11/06/14   Anitra Rocky KIDD, PA-C  methylPREDNISolone  (MEDROL  DOSEPAK) 4 MG TBPK tablet Take as directed  06/18/20   Vonn Hadassah LABOR, PA-C  naproxen  (NAPROSYN ) 375 MG tablet Take 1 tablet (375 mg total) by mouth 2 (two) times daily. 04/06/20   Patel, Shalyn, PA-C  oxyCODONE  (OXY IR/ROXICODONE ) 5 MG immediate release tablet TAKE ONE TABLET BY MOUTH TWICE A DAY AS NEEDED FOR 30 DAYS FILL DATE 12/28/2020 12/29/20   [provider]  oxycodone  (OXY-IR) 5 MG capsule Take 10 mg by mouth every 4 (four) hours as needed.    [provider]  pregabalin (LYRICA) 150 MG capsule Take 150 mg by mouth 3 (three) times daily.    [provider]  sertraline  (ZOLOFT ) 100 MG tablet Half  qam  for 6 days then 1 qam 03/22/23   Plovsky, Elna, MD  traZODone  (DESYREL ) 100 MG tablet 3  qhs 07/03/24   Plovsky, Elna, MD    Allergies: Penicillins and Tylenol [acetaminophen]    Review of Systems  Constitutional:  Negative for chills and fever.  Cardiovascular:  Positive for leg swelling.  Musculoskeletal:  Positive for arthralgias.  All other systems reviewed and are negative.   Updated Vital Signs BP 97/86 (BP Location: Right Arm)   Pulse 86   Temp 97.8 F (36.6 C)   Resp 20   SpO2 95%   Physical Exam Vitals and nursing note reviewed.  Constitutional:      General: She is not in acute distress.  Appearance: Normal appearance. She is not ill-appearing.  HENT:     Head: Normocephalic and atraumatic.     Nose: Nose normal.  Eyes:     Conjunctiva/sclera: Conjunctivae normal.  Pulmonary:     Effort: Pulmonary effort is normal. No respiratory distress.  Musculoskeletal:        General: No deformity.     Comments: Pain in the right lower extremity with range of motion.  No visible erythema.  No purulent drainage.  Incision site looks well-healed.  See attached image for description.  Neurovascularly intact.  Compartments are soft.  Skin:    Findings: No rash.  Neurological:     Mental Status: She is alert.     (all labs ordered are listed, but only abnormal results are  displayed) Labs Reviewed  CBC WITH DIFFERENTIAL/PLATELET  BASIC METABOLIC PANEL WITH GFR    EKG: None  Radiology: No results found.   Procedures   Medications Ordered in the ED - No data to display                                  Medical Decision Making Amount and/or Complexity of Data Reviewed Labs: ordered. Radiology: ordered.   Medical Decision Making / ED Course   This patient presents to the ED for concern of leg pain, leg swelling, this involves an extensive number of treatment options, and is a complaint that carries with it a high risk of complications and morbidity.  The differential diagnosis includes DVT, postop complication, skin infection  MDM: 53 year old female who is postop presents with right leg pain and swelling. Will obtain blood work, knee x-ray, DVT study. Will provide pain control.  DVT study negative.  X-ray without acute findings.  Blood work reassuring with the exception of hemoglobin of 9.5.  She denies any bleeding she has noticed.  No melanotic stools.  Hemodynamically she is stable.  Op note shows that she had 150 mL of blood loss during surgery which should not have led to this. Discussed follow-up with her PCP for reevaluation. She also sees her surgeon tomorrow for reevaluation of the site.  No signs of infection on my evaluation.  See attached image above.  Patient discharged in stable condition.  Discussed with attending.  Patient feels comfortable with going home and following up with her surgeon tomorrow.    Additional history obtained: -Additional history obtained from op note -External records from outside source obtained and reviewed including: Chart review including previous notes, labs, imaging, consultation notes   Lab Tests: -I ordered, reviewed, and interpreted labs.   The pertinent results include:   Labs Reviewed  CBC WITH DIFFERENTIAL/PLATELET - Abnormal; Notable for the following components:      Result Value    RBC 3.47 (*)    Hemoglobin 9.5 (*)    HCT 29.0 (*)    RDW 16.8 (*)    Platelets 535 (*)    All other components within normal limits  BASIC METABOLIC PANEL WITH GFR      EKG  EKG Interpretation Date/Time:    Ventricular Rate:    PR Interval:    QRS Duration:    QT Interval:    QTC Calculation:   R Axis:      Text Interpretation:           Imaging Studies ordered: I ordered imaging studies including DVT study of the right lower extremity, right knee x-ray  I independently visualized and interpreted imaging. I agree with the radiologist interpretation   Medicines ordered and prescription drug management: No orders of the defined types were placed in this encounter.   -I have reviewed the patients home medicines and have made adjustments as needed   Reevaluation: After the interventions noted above, I reevaluated the patient and found that they have :improved  Co morbidities that complicate the patient evaluation  Past Medical History:  Diagnosis Date   Adjustment disorder with mixed anxiety and depressed mood    Anxiety    Bipolar 1 disorder (HCC)    Chronic pain    Depression    Fibromyalgia    Hypertension    Migraine    Morbid obesity (HCC)    MRSA (methicillin resistant staph aureus) culture positive    Myalgia and myositis    Opiate dependence (HCC)    Prediabetes       Dispostion: Discharged in stable condition.  Return precaution discussed.  Patient voices understanding and is in agreement with plan.   Final diagnoses:  Leg swelling  Low hemoglobin    ED Discharge Orders     None          Hildegard Loge, PA-C 08/06/24 1805    Lenor Hollering, MD 08/06/24 [phone]

## 2024-08-06 NOTE — Discharge Instructions (Signed)
 No concerning findings on the workup today.  No evidence of blood clot.  X-ray did not show any worrisome findings.  Blood work showed that your hemoglobin was lower than your normal.  No signs of bleeding that you reported to me.  Your vital signs today were reassuring.  Please have your primary care doctor recheck this to ensure that it does not continue to drop.  If you notice any signs of bleeding such as bright red blood in your stools, dark or black stools, or if you throw up blood please return for reevaluation. Continue taking your pain medication at home.  Follow-up with the orthopedic surgeon at your scheduled appointment tomorrow.

## 2024-08-28 ENCOUNTER — Ambulatory Visit (HOSPITAL_COMMUNITY): Admitting: Psychiatry

## 2024-09-03 ENCOUNTER — Other Ambulatory Visit: Payer: Self-pay

## 2024-09-03 ENCOUNTER — Ambulatory Visit (HOSPITAL_COMMUNITY): Admitting: Psychiatry

## 2024-09-03 VITALS — BP 156/89 | HR 87 | Ht 66.0 in | Wt 238.0 lb

## 2024-09-03 DIAGNOSIS — F341 Dysthymic disorder: Secondary | ICD-10-CM

## 2024-09-03 MED ORDER — DULOXETINE HCL 60 MG PO CPEP: ORAL_CAPSULE | ORAL | 3 refills | Status: AC

## 2024-09-03 MED ORDER — TEMAZEPAM 15 MG PO CAPS: 15.0000 mg | ORAL_CAPSULE | Freq: Every evening | ORAL | 4 refills | Status: AC | PRN

## 2024-09-03 NOTE — Progress Notes (Signed)
 Psychiatric Initial Adult Assessment   Patient Identification: Debra Cardenas  MRN:  [id number] Date of Evaluation:  09/03/2024 Referral Source:  Chief Complaint: Depression Visit Diagnosis No diagnosis found.    History of Present Illness:     Today the patient is seen and she is mainly bothered by her knee and surgery.  She is on a cane.  She sees the surgery and surgeon in follow-up in the next week.  So she had her surgery and she is either a lot of pain.  The trazodone  did not help.  She is only sleeping about 4 hours at night.  She has chronic pain and I believe she has some mild increase in depression.  The low-dose Cymbalta  had no effect.  Patient is also bothered by the very cold weather.  Patient had her Neurontin changed to Lyrica that she takes 3 times a day.  Patient is eating okay.  Her mood seems to be fairly good but I think she is very uncomfortable..  Associated Signs/Symptoms: Depression Symptoms:  depressed mood, (Hypo) Manic Symptoms:   Anxiety Symptoms:  Excessive Worry, Psychotic Symptoms:   PTSD Symptoms: NA  Previous Psychotropic Medications: Yes   Substance Abuse History in the last 12 months:  No.  Consequences of Substance Abuse: NA  Past Medical History:  Past Medical History:  Diagnosis Date   Adjustment disorder with mixed anxiety and depressed mood    Anxiety    Bipolar 1 disorder (HCC)    Chronic pain    Depression    Fibromyalgia    Hypertension    Migraine    Morbid obesity (HCC)    MRSA (methicillin resistant staph aureus) culture positive    Myalgia and myositis    Opiate dependence (HCC)    Prediabetes     Past Surgical History:  Procedure Laterality Date   ABDOMINAL HYSTERECTOMY     CESAREAN SECTION     EAR CYST EXCISION     x3   KNEE ARTHROPLASTY     TUBAL LIGATION     uterine ablasion      Family Psychiatric History:   Family History:   Social History:   Social History   Socioeconomic History   Marital  status: Married    Spouse name: Not on file   Number of children: Not on file   Years of education: Not on file   Highest education level: Not on file  Occupational History   Not on file  Tobacco Use   Smoking status: Never   Smokeless tobacco: Never  Vaping Use   Vaping status: Never Used  Substance and Sexual Activity   Alcohol use: No   Drug use: No   Sexual activity: Not Currently    Birth control/protection: Surgical  Other Topics Concern   Not on file  Social History Narrative   ** Merged History Encounter **       Social Drivers of Health   Tobacco Use: Low Risk (08/07/2024)   Received from [facility]   Patient History    Smoking Tobacco Use: Never    Smokeless Tobacco Use: Never    Passive Exposure: Not on file  Financial Resource Strain: Not on file  Food Insecurity: High Risk (06/03/2024)   Received from [facility]   Epic    Within the past 12 months, you worried that your food would run out before you got money to buy more: Sometimes true    Within the past 12 months, the food you bought  just didn't last and you didn't have money to get more. : Often true  Transportation Needs: No Transportation Needs (06/03/2024)   Received from [facility]    In the past 12 months, has lack of reliable transportation kept you from medical appointments, meetings, work or from getting things needed for daily living? : No  Physical Activity: Not on file  Stress: Not on file  Social Connections: Unknown (10/10/2022)   Received from [facility]   Social Network    Social Network: Not on file  Depression (PHQ2-9): Not on file  Alcohol Screen: Not on file  Housing: Low Risk (06/03/2024)   Received from [facility]   Epic    What is your living situation today?: I have a steady place to live    Think about the place you live. Do you have problems with any of the following? Choose all that apply:: None/None on this list  Utilities: Low Risk  (06/03/2024)   Received from [facility]   Utilities    In the past 12 months has the electric, gas, oil, or water company threatened to shut off services in your home? : No  Health Literacy: Not on file    Additional Social History:   Allergies:   Allergies  Allergen Reactions   Penicillins Hives    Chest pain    Tylenol [Acetaminophen]     Massive migraine    Metabolic Disorder Labs: No results found for: HGBA1C, MPG No results found for: PROLACTIN No results found for: CHOL, TRIG, HDL, CHOLHDL, VLDL, LDLCALC No results found for: TSH  Therapeutic Level Labs: No results found for: LITHIUM No results found for: CBMZ No results found for: VALPROATE  Current Medications: Current Outpatient Medications  Medication Sig Dispense Refill   benzonatate  (TESSALON ) 100 MG capsule Take 1 capsule (100 mg total) by mouth every 8 (eight) hours. 21 capsule 0   busPIRone  (BUSPAR ) 10 MG tablet 1  bid for  1week then 2 BID 120 tablet 4   cyclobenzaprine  (FLEXERIL ) 10 MG tablet Take 1 tablet (10 mg total) by mouth 2 (two) times daily as needed for muscle spasms. 10 tablet 0   fluticasone (FLONASE) 50 MCG/ACT nasal spray Place 2 sprays into both nostrils daily.     gabapentin (NEURONTIN) 300 MG capsule Take 300 mg by mouth 3 (three) times daily.     hydrOXYzine  (ATARAX ) 25 MG tablet Take 1 tablet (25 mg total) by mouth every 6 (six) hours as needed for anxiety. 12 tablet 0   ibuprofen  (ADVIL ,MOTRIN ) 600 MG tablet Take 1 tablet (600 mg total) by mouth every 6 (six) hours as needed. 30 tablet 0   methylPREDNISolone  (MEDROL  DOSEPAK) 4 MG TBPK tablet Take as directed 1 each 0   naproxen  (NAPROSYN ) 375 MG tablet Take 1 tablet (375 mg total) by mouth 2 (two) times daily. 20 tablet 0   oxyCODONE  (OXY IR/ROXICODONE ) 5 MG immediate release tablet TAKE ONE TABLET BY MOUTH TWICE A DAY AS NEEDED FOR 30 DAYS FILL DATE 12/28/2020     oxycodone  (OXY-IR) 5 MG capsule Take 10 mg by  mouth every 4 (four) hours as needed.     pregabalin (LYRICA) 150 MG capsule Take 150 mg by mouth 3 (three) times daily.     sertraline  (ZOLOFT ) 100 MG tablet Half  qam  for 6 days then 1 qam 30 tablet 5   temazepam  (RESTORIL ) 15 MG capsule Take 1 capsule (15 mg total) by mouth at bedtime  as needed for sleep. 30 capsule 4   traZODone  (DESYREL ) 100 MG tablet 3  qhs 120 tablet 3   DULoxetine  (CYMBALTA ) 60 MG capsule 1 qam  for 1 week then 2 qam 60 capsule 3   No current facility-administered medications for this visit.    Musculoskeletal: Strength & Muscle Tone: within normal limits Gait & Station: normal Patient leans: N/A  Psychiatric Specialty Exam: Review of Systems  Blood pressure (!) 156/89, pulse 87, height 5' 6 (1.676 m), weight 238 lb (108 kg).Body mass index is 38.41 kg/m.  General Appearance: Casual  Eye Contact:  Good  Speech:  Negative  Volume:  Normal  Mood:  Depressed  Affect:  Appropriate  Thought Process:  Goal Directed  Orientation:  NA  Thought Content:  WDL  Suicidal Thoughts:  No  H Create Note  Est Patient 1  My Note      Progress Notes  Psychiatry  10/12/2022 03:00 PM  ROS  Insert SmartText   omicidal Thoughts:  No  Memory:  NA  Judgement:  Good  Insight:  Good  Psychomotor Activity:  Normal  Concentration:    Recall:  Good  Fund of Knowledge:Good  Language: Good  Akathisia:  NA  Handed:  Right  AIMS (if indicated):  not done  Assets:  Desire for Improvement  ADL's:  Intact  Cognition: WNL  Sleep:  Fair   Screenings: Flowsheet Row ED from 08/06/2024 in [facility] Emergency Department at [facility] ED from 12/18/2022 in [facility] Emergency Department at [facility] ED from 07/09/2022 in [facility] Emergency Department at [facility]  C-SSRS RISK CATEGORY No Risk No Risk No Risk    Assessment and Plan:     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  Today we will go  ahead and increase her Cymbalta  to 60 mg that she will take for 1 week and then increase it to the maximum dose of 120 mg.  This is for pain and for depression.  She has a problem with insomnia at this time and she will go ahead and start Restoril  15 mg at night.  The patient also received another list for therapist.  So it is my hope that when she returns in approximately 7 weeks she will be sleeping better from the Restoril  her pain and mood will be better by Cymbalta .  At that time hopefully her knee will be better simply in the process of recovery.  Patient/Guardian was advised Release of Information must be obtained prior to any record release in order to collaborate their care with an outside provider. Patient/Guardian was advised if they have not already done so to contact the registration department to sign all necessary forms in order for us  to release information regarding their care.   Consent: Patient/Guardian gives verbal consent for treatment and assignment of benefits for services provided during this visit. Patient/Guardian expressed understanding and agreed to proceed.   Elna LILLETTE Lo, MD 12/16/20254:56 PM

## 2024-11-05 ENCOUNTER — Ambulatory Visit (HOSPITAL_COMMUNITY): Admitting: Psychiatry
